# Patient Record
Sex: Female | Born: 1960 | Race: Black or African American | Hispanic: No | Marital: Married | Smoking: Never smoker
Health system: Southern US, Community
[De-identification: ages and names within clinical notes are randomized; demographics above are authoritative.]

## PROBLEM LIST (undated history)

## (undated) DIAGNOSIS — I639 Cerebral infarction, unspecified: Secondary | ICD-10-CM

## (undated) DIAGNOSIS — M5030 Other cervical disc degeneration, unspecified cervical region: Secondary | ICD-10-CM

## (undated) DIAGNOSIS — E119 Type 2 diabetes mellitus without complications: Secondary | ICD-10-CM

## (undated) DIAGNOSIS — I2699 Other pulmonary embolism without acute cor pulmonale: Secondary | ICD-10-CM

## (undated) DIAGNOSIS — I10 Essential (primary) hypertension: Secondary | ICD-10-CM

## (undated) HISTORY — PX: HYSTEROSCOPY: SHX211

## (undated) HISTORY — PX: HAND SURGERY: SHX662

## (undated) HISTORY — PX: CHOLECYSTECTOMY: SHX55

---

## 2006-10-13 ENCOUNTER — Ambulatory Visit: Payer: Self-pay | Admitting: Orthopedic Surgery

## 2006-10-20 ENCOUNTER — Ambulatory Visit: Payer: Self-pay | Admitting: Orthopedic Surgery

## 2006-10-29 ENCOUNTER — Ambulatory Visit: Payer: Self-pay | Admitting: Orthopedic Surgery

## 2006-11-04 ENCOUNTER — Encounter: Admission: RE | Admit: 2006-11-04 | Discharge: 2006-11-04 | Payer: Self-pay | Admitting: Orthopedic Surgery

## 2006-11-06 ENCOUNTER — Ambulatory Visit: Payer: Self-pay | Admitting: Orthopedic Surgery

## 2006-11-18 ENCOUNTER — Encounter: Admission: RE | Admit: 2006-11-18 | Discharge: 2006-11-18 | Payer: Self-pay | Admitting: Orthopedic Surgery

## 2006-11-25 ENCOUNTER — Ambulatory Visit: Payer: Self-pay | Admitting: Orthopedic Surgery

## 2006-11-25 ENCOUNTER — Ambulatory Visit (HOSPITAL_COMMUNITY): Admission: RE | Admit: 2006-11-25 | Discharge: 2006-11-25 | Payer: Self-pay | Admitting: Internal Medicine

## 2006-11-27 ENCOUNTER — Ambulatory Visit: Payer: Self-pay | Admitting: Orthopedic Surgery

## 2006-12-02 ENCOUNTER — Encounter: Admission: RE | Admit: 2006-12-02 | Discharge: 2006-12-02 | Payer: Self-pay | Admitting: Orthopedic Surgery

## 2006-12-08 ENCOUNTER — Ambulatory Visit: Payer: Self-pay | Admitting: Orthopedic Surgery

## 2006-12-15 ENCOUNTER — Ambulatory Visit: Payer: Self-pay | Admitting: Orthopedic Surgery

## 2006-12-17 ENCOUNTER — Encounter: Payer: Self-pay | Admitting: Orthopedic Surgery

## 2007-09-08 ENCOUNTER — Ambulatory Visit: Payer: Self-pay | Admitting: Orthopedic Surgery

## 2007-09-08 DIAGNOSIS — M545 Low back pain: Secondary | ICD-10-CM

## 2007-09-15 ENCOUNTER — Telehealth: Payer: Self-pay | Admitting: Orthopedic Surgery

## 2007-09-28 ENCOUNTER — Encounter: Admission: RE | Admit: 2007-09-28 | Discharge: 2007-09-28 | Payer: Self-pay | Admitting: Orthopedic Surgery

## 2007-10-12 ENCOUNTER — Encounter: Admission: RE | Admit: 2007-10-12 | Discharge: 2007-10-12 | Payer: Self-pay | Admitting: Orthopedic Surgery

## 2007-10-19 ENCOUNTER — Ambulatory Visit: Payer: Self-pay | Admitting: Orthopedic Surgery

## 2007-11-13 ENCOUNTER — Telehealth: Payer: Self-pay | Admitting: Orthopedic Surgery

## 2008-02-29 ENCOUNTER — Ambulatory Visit: Payer: Self-pay | Admitting: Internal Medicine

## 2008-02-29 DIAGNOSIS — N951 Menopausal and female climacteric states: Secondary | ICD-10-CM | POA: Insufficient documentation

## 2008-02-29 DIAGNOSIS — J309 Allergic rhinitis, unspecified: Secondary | ICD-10-CM | POA: Insufficient documentation

## 2008-02-29 DIAGNOSIS — I499 Cardiac arrhythmia, unspecified: Secondary | ICD-10-CM | POA: Insufficient documentation

## 2008-02-29 DIAGNOSIS — E785 Hyperlipidemia, unspecified: Secondary | ICD-10-CM

## 2008-02-29 DIAGNOSIS — I10 Essential (primary) hypertension: Secondary | ICD-10-CM | POA: Insufficient documentation

## 2008-02-29 DIAGNOSIS — I509 Heart failure, unspecified: Secondary | ICD-10-CM | POA: Insufficient documentation

## 2008-02-29 DIAGNOSIS — M1990 Unspecified osteoarthritis, unspecified site: Secondary | ICD-10-CM | POA: Insufficient documentation

## 2008-02-29 DIAGNOSIS — IMO0001 Reserved for inherently not codable concepts without codable children: Secondary | ICD-10-CM | POA: Insufficient documentation

## 2008-02-29 DIAGNOSIS — J45909 Unspecified asthma, uncomplicated: Secondary | ICD-10-CM | POA: Insufficient documentation

## 2008-02-29 DIAGNOSIS — Z87898 Personal history of other specified conditions: Secondary | ICD-10-CM | POA: Insufficient documentation

## 2008-02-29 DIAGNOSIS — F411 Generalized anxiety disorder: Secondary | ICD-10-CM | POA: Insufficient documentation

## 2008-02-29 DIAGNOSIS — N318 Other neuromuscular dysfunction of bladder: Secondary | ICD-10-CM | POA: Insufficient documentation

## 2008-02-29 DIAGNOSIS — E0789 Other specified disorders of thyroid: Secondary | ICD-10-CM | POA: Insufficient documentation

## 2008-02-29 DIAGNOSIS — K219 Gastro-esophageal reflux disease without esophagitis: Secondary | ICD-10-CM

## 2008-02-29 DIAGNOSIS — K5900 Constipation, unspecified: Secondary | ICD-10-CM | POA: Insufficient documentation

## 2008-03-01 ENCOUNTER — Telehealth (INDEPENDENT_AMBULATORY_CARE_PROVIDER_SITE_OTHER): Payer: Self-pay | Admitting: Internal Medicine

## 2008-03-01 DIAGNOSIS — E669 Obesity, unspecified: Secondary | ICD-10-CM | POA: Insufficient documentation

## 2008-03-03 ENCOUNTER — Telehealth (INDEPENDENT_AMBULATORY_CARE_PROVIDER_SITE_OTHER): Payer: Self-pay | Admitting: Internal Medicine

## 2008-03-07 ENCOUNTER — Encounter (INDEPENDENT_AMBULATORY_CARE_PROVIDER_SITE_OTHER): Payer: Self-pay | Admitting: Internal Medicine

## 2008-03-08 ENCOUNTER — Ambulatory Visit: Payer: Self-pay | Admitting: Internal Medicine

## 2008-03-08 ENCOUNTER — Ambulatory Visit (HOSPITAL_COMMUNITY): Admission: RE | Admit: 2008-03-08 | Discharge: 2008-03-08 | Payer: Self-pay | Admitting: Internal Medicine

## 2008-03-23 ENCOUNTER — Encounter (INDEPENDENT_AMBULATORY_CARE_PROVIDER_SITE_OTHER): Payer: Self-pay | Admitting: Internal Medicine

## 2008-03-28 ENCOUNTER — Ambulatory Visit: Payer: Self-pay | Admitting: Internal Medicine

## 2008-03-28 DIAGNOSIS — G4700 Insomnia, unspecified: Secondary | ICD-10-CM

## 2008-03-29 ENCOUNTER — Encounter (INDEPENDENT_AMBULATORY_CARE_PROVIDER_SITE_OTHER): Payer: Self-pay | Admitting: Internal Medicine

## 2008-04-04 ENCOUNTER — Telehealth (INDEPENDENT_AMBULATORY_CARE_PROVIDER_SITE_OTHER): Payer: Self-pay | Admitting: *Deleted

## 2008-04-25 ENCOUNTER — Telehealth (INDEPENDENT_AMBULATORY_CARE_PROVIDER_SITE_OTHER): Payer: Self-pay | Admitting: *Deleted

## 2008-04-25 ENCOUNTER — Ambulatory Visit: Payer: Self-pay | Admitting: Internal Medicine

## 2008-04-27 ENCOUNTER — Ambulatory Visit: Payer: Self-pay | Admitting: Internal Medicine

## 2008-04-27 ENCOUNTER — Ambulatory Visit (HOSPITAL_COMMUNITY): Admission: RE | Admit: 2008-04-27 | Discharge: 2008-04-27 | Payer: Self-pay | Admitting: Internal Medicine

## 2008-04-27 DIAGNOSIS — J45901 Unspecified asthma with (acute) exacerbation: Secondary | ICD-10-CM | POA: Insufficient documentation

## 2008-04-27 LAB — CONVERTED CEMR LAB
AST: 23 units/L (ref 0–37)
Albumin: 3.6 g/dL (ref 3.5–5.2)
BUN: 10 mg/dL (ref 6–23)
Basophils Absolute: 0 10*3/uL (ref 0.0–0.1)
Eosinophils Absolute: 0.2 10*3/uL (ref 0.0–0.7)
Glucose, Bld: 94 mg/dL (ref 70–99)
HCT: 42.3 % (ref 36.0–46.0)
Hemoglobin: 14.1 g/dL (ref 12.0–15.0)
Lymphs Abs: 3.3 10*3/uL (ref 0.7–4.0)
MCV: 94 fL (ref 78.0–100.0)
Neutro Abs: 4.3 10*3/uL (ref 1.7–7.7)
Neutrophils Relative %: 49 % (ref 43–77)
Potassium: 3.8 meq/L (ref 3.5–5.3)
RBC: 4.49 M/uL (ref 3.87–5.11)
Total Bilirubin: 0.7 mg/dL (ref 0.3–1.2)
Total Protein: 6.9 g/dL (ref 6.0–8.3)

## 2008-05-02 ENCOUNTER — Telehealth (INDEPENDENT_AMBULATORY_CARE_PROVIDER_SITE_OTHER): Payer: Self-pay | Admitting: Internal Medicine

## 2008-05-04 ENCOUNTER — Ambulatory Visit: Payer: Self-pay | Admitting: Internal Medicine

## 2008-05-04 ENCOUNTER — Encounter
Admission: RE | Admit: 2008-05-04 | Discharge: 2008-08-02 | Payer: Self-pay | Admitting: Physical Medicine & Rehabilitation

## 2008-05-04 DIAGNOSIS — R609 Edema, unspecified: Secondary | ICD-10-CM

## 2008-05-04 DIAGNOSIS — M810 Age-related osteoporosis without current pathological fracture: Secondary | ICD-10-CM | POA: Insufficient documentation

## 2008-05-04 LAB — CONVERTED CEMR LAB
Blood in Urine, dipstick: NEGATIVE
Ketones, urine, test strip: NEGATIVE
Nitrite: NEGATIVE
Urobilinogen, UA: 0.2
WBC Urine, dipstick: NEGATIVE

## 2008-05-05 ENCOUNTER — Telehealth (INDEPENDENT_AMBULATORY_CARE_PROVIDER_SITE_OTHER): Payer: Self-pay | Admitting: Internal Medicine

## 2008-05-16 ENCOUNTER — Ambulatory Visit: Payer: Self-pay | Admitting: Physical Medicine & Rehabilitation

## 2008-05-16 ENCOUNTER — Telehealth (INDEPENDENT_AMBULATORY_CARE_PROVIDER_SITE_OTHER): Payer: Self-pay | Admitting: *Deleted

## 2008-05-17 ENCOUNTER — Telehealth (INDEPENDENT_AMBULATORY_CARE_PROVIDER_SITE_OTHER): Payer: Self-pay | Admitting: Internal Medicine

## 2008-05-19 ENCOUNTER — Telehealth (INDEPENDENT_AMBULATORY_CARE_PROVIDER_SITE_OTHER): Payer: Self-pay | Admitting: Internal Medicine

## 2008-05-20 ENCOUNTER — Telehealth (INDEPENDENT_AMBULATORY_CARE_PROVIDER_SITE_OTHER): Payer: Self-pay | Admitting: Internal Medicine

## 2008-05-25 ENCOUNTER — Encounter (INDEPENDENT_AMBULATORY_CARE_PROVIDER_SITE_OTHER): Payer: Self-pay | Admitting: Internal Medicine

## 2008-05-30 ENCOUNTER — Telehealth (INDEPENDENT_AMBULATORY_CARE_PROVIDER_SITE_OTHER): Payer: Self-pay | Admitting: *Deleted

## 2008-06-01 ENCOUNTER — Ambulatory Visit: Payer: Self-pay | Admitting: Internal Medicine

## 2009-06-12 ENCOUNTER — Ambulatory Visit: Payer: Self-pay | Admitting: Orthopedic Surgery

## 2009-06-12 DIAGNOSIS — M67919 Unspecified disorder of synovium and tendon, unspecified shoulder: Secondary | ICD-10-CM | POA: Insufficient documentation

## 2009-06-12 DIAGNOSIS — M719 Bursopathy, unspecified: Secondary | ICD-10-CM

## 2009-06-12 DIAGNOSIS — M7580 Other shoulder lesions, unspecified shoulder: Secondary | ICD-10-CM

## 2009-07-06 ENCOUNTER — Ambulatory Visit: Payer: Self-pay | Admitting: Orthopedic Surgery

## 2009-07-06 DIAGNOSIS — IMO0002 Reserved for concepts with insufficient information to code with codable children: Secondary | ICD-10-CM

## 2009-09-05 ENCOUNTER — Ambulatory Visit: Payer: Self-pay | Admitting: Orthopedic Surgery

## 2009-09-06 ENCOUNTER — Telehealth: Payer: Self-pay | Admitting: Orthopedic Surgery

## 2009-09-06 ENCOUNTER — Encounter (INDEPENDENT_AMBULATORY_CARE_PROVIDER_SITE_OTHER): Payer: Self-pay | Admitting: *Deleted

## 2009-09-06 DIAGNOSIS — M2550 Pain in unspecified joint: Secondary | ICD-10-CM | POA: Insufficient documentation

## 2009-09-07 ENCOUNTER — Telehealth: Payer: Self-pay | Admitting: Orthopedic Surgery

## 2009-09-07 ENCOUNTER — Encounter: Payer: Self-pay | Admitting: Orthopedic Surgery

## 2009-09-14 ENCOUNTER — Ambulatory Visit: Payer: Self-pay | Admitting: Orthopedic Surgery

## 2009-09-14 ENCOUNTER — Encounter (INDEPENDENT_AMBULATORY_CARE_PROVIDER_SITE_OTHER): Payer: Self-pay | Admitting: *Deleted

## 2009-09-21 ENCOUNTER — Telehealth: Payer: Self-pay | Admitting: Orthopedic Surgery

## 2009-09-22 ENCOUNTER — Ambulatory Visit: Payer: Self-pay | Admitting: Orthopedic Surgery

## 2009-09-22 ENCOUNTER — Ambulatory Visit (HOSPITAL_COMMUNITY): Admission: RE | Admit: 2009-09-22 | Discharge: 2009-09-22 | Payer: Self-pay | Admitting: Orthopedic Surgery

## 2009-09-27 ENCOUNTER — Ambulatory Visit: Payer: Self-pay | Admitting: Orthopedic Surgery

## 2009-11-13 ENCOUNTER — Encounter: Payer: Self-pay | Admitting: Orthopedic Surgery

## 2010-05-13 ENCOUNTER — Encounter: Payer: Self-pay | Admitting: Internal Medicine

## 2010-05-14 ENCOUNTER — Encounter: Payer: Self-pay | Admitting: Orthopedic Surgery

## 2010-05-22 NOTE — Letter (Signed)
Summary: [facility]  [address]  [place], [place] [address]   Phone: [phone]  Fax: [phone]     Today's Date: June 12, 2009  Name of Patient: Katherine Barry  The above named patient had a medical visit today at:  2:00pm.  Please take this into consideration when reviewing the time away from work.    [ X] To be off the remainder of today, returning to the normal work / school schedule tomorrow, 06/13/09.         - Next scheduled appointment:  June 26, 2009, 2:15pm   ______________________.   Sincerely yours,   Terrance Mass, MD

## 2010-05-22 NOTE — Letter (Signed)
Summary: *Orthopedic Missed Appt/ No Show Letter  [facility]  [address]  [place], [place] [address]   Phone: [phone]  Fax: [phone]    11/13/2009  Katherine Barry  [address] [place], [place] [address]     Dear Ms. Poland,   Our records indicate that you missed your scheduled appointment with Dr. Beaulah Corin on 11/08/2009.  Please contact this office to reschedule your appointment as soon as possible.  It is important that you keep your scheduled appointments with your physician, so we can provide you the best care possible.        Sincerely,    Dr. Terrance Mass, MD [facility] Phone [phone]

## 2010-05-22 NOTE — Miscellaneous (Signed)
Summary: rheumatology referral  Clinical Lists Changes  Problems: Added new problem of PAIN IN JOINT, MULTIPLE SITES (ICD-719.49) Orders: Added new Referral order of Rheumatology Referral (Rheumatology) - Signed

## 2010-05-22 NOTE — Progress Notes (Signed)
Summary: No pre-auth required for out-patient procedure  Phone Note Outgoing Call   Call placed to: Insurer Summary of Call: No pre-cert required for out-patient procedure sched'd at [facility] 09/22/09 ( [id number]) - insurer Medicare. Initial call taken by: Cammie Sickle,  September 21, 2009 2:00 PM

## 2010-05-22 NOTE — Assessment & Plan Note (Signed)
Summary: MRI results from [facility]/ pt aware to bring films/frs   Visit Type:  Follow-up Primary Provider:  Dr. Naida Sleight  CC:  right shoulder pain.  History of Present Illness: I saw Katherine Barry in the office today for a followup visit.  She is a 50 years old woman with the complaint of:  right shoulder.  50 year old female who I treated in February for impingement syndrome with subacromial injection, Voltaren gel and Norco 5 mg presents back with recurrent RIGHT shoulder pain and multiple joint swelling.  Review of Systems: She was previously treated for rheumatoid arthritis with injection therapy but her doctor left-handed she has not been treated for several years.  All of her joints are aching and swelling.  Her RIGHT shoulder is hurting worse.  She has upper arm pain with painful forward elevation and radiation into the upper arm.   she is bilateral hand swelling with history of RIGHT carpal tunnel release and history of LEFT carpal tunnel syndrome  MRI results. [facility] : 09/07/2009  MILD AC JOINT CHANGES NON SURGICAL  PARTIAL THICKNESS TEAR SUPRASPINATUS  TENDONOSIS SUPRASPINATUS    Current Medications (verified): 1)  Norco 5-325 Mg Tabs (Hydrocodone-Acetaminophen) .Marland Kitchen.. 1 By Mouth Q 4 As Needed Pain 2)  Voltaren 1 % Gel (Diclofenac Sodium) .... Apply 4 Grams To Area Bid 3)  Toprol Xl 25 Mg Xr24h-Tab (Metoprolol Succinate) 4)  Ec-Naprosyn 500 Mg Tbec (Naproxen) .Marland Kitchen.. 1 By Mouth Two Times A Day  Allergies (verified): No Known Drug Allergies  Past History:  Past Medical History: Last updated: 05/04/2008 Allergic rhinitis Anxiety Asthma Congestive heart failure GERD Hyperlipidemia Hypertension Low back pain Osteoarthritis arrhythmia bronchitis, chronic constipation overactive bladder fibromyalgia migraines, hx thyroid goiter  Past Surgical History: Last updated: 02/29/2008 Cholecystectomy hysterectomy with bilat SOO Appendectomy  Family History: Last  updated: 04-03-2008 father-deceased-66-CVD, CVA mother--HTN, "breathing problems" son-27 daughter-29 5 adopted children  Social History: Last updated: 02/29/2008 Married lives with spouse and children Never Smoked Alcohol use-no Drug use-no  Disabled for "fibromyalgia" and a little bit of everything.    Risk Factors: Smoking Status: never (02/29/2008)  Review of Systems Constitutional:  Denies weight loss, weight gain, fever, chills, and fatigue. Cardiovascular:  Denies chest pain, palpitations, fainting, and murmurs. Respiratory:  Denies short of breath, wheezing, couch, tightness, pain on inspiration, and snoring . Gastrointestinal:  Denies heartburn, nausea, vomiting, diarrhea, constipation, and blood in your stools. Genitourinary:  Denies frequency, urgency, difficulty urinating, painful urination, flank pain, and bleeding in urine. Neurologic:  Complains of numbness; denies tingling, unsteady gait, dizziness, tremors, and seizure; both hands. Musculoskeletal:  See HPI. Endocrine:  Denies excessive thirst, exessive urination, and heat or cold intolerance. Psychiatric:  Denies nervousness, depression, anxiety, and hallucinations. Skin:  Denies changes in the skin, poor healing, rash, itching, and redness. HEENT:  Denies blurred or double vision, eye pain, redness, and watering. Immunology:  Denies seasonal allergies, sinus problems, and allergic to bee stings. Hemoatologic:  Denies easy bleeding and brusing.  Physical Exam  Additional Exam:  Constitutional: vital signs see recorded values. General: normal development, nutrition, and grooming. No deformity. Body Habitus is large CDV: Observation and palpation was normal  Lymph: palpation of the lymph nodes were normal Skin: inspection and palpation of the skin revealed no abnormalities  Neuro: coordination: normal              DTR's normal              Sensation was normal  Psyche: Alert  and oriented x 3. Mood was normal.   Affect: normal  MSK: Gait: normal   She is tender in the upper arm over the lateral deltoid and posterior subacromial area.  She has active range of motion of 90 forward elevation and 120 active range of motion with painful arc of motion 90-120.  The joint remained stable her motor grade was difficult to assess but essentially is a grade 5 minus.  Skin is intact skin is not warm to touch.  Pulses normal.  C-Spine normal range of motion no tenderness  LOWER EXTREMS: Normal alignment and no atrophy, subluxation or tremor or contracture       Impression & Recommendations:  Problem # 1:  BURSITIS, RIGHT SHOULDER (ICD-726.10) Assessment Unchanged  SARS ASAD plane the AC joint if needed   Orders: Est. Patient Level IV (09811)  Patient Instructions: 1)  Offered surgery vs non op treatment  2)  You have chosen surgery 3)  You will have arthroscopy of the shoulder with bursectomy 4)  DOS 09/22/09  5)  Preop, I will call you later and let you know when to go to [place] short stay center for preop, make sure you take packet with you 6)  Post op 1 in our office on 09/27/09 Prescriptions: NORCO 5-325 MG TABS (HYDROCODONE-ACETAMINOPHEN) 1 by mouth q 4 as needed pain  #84 x 0   Entered and Authorized by:   Fuller Canada MD   Signed by:   Fuller Canada MD on 09/14/2009   Method used:   Print then Give to Patient   RxID:   [id number]

## 2010-05-22 NOTE — Letter (Signed)
Summary: surgey order  surgey order   Imported By: Cammie Sickle 09/27/2009 17:56:16  _____________________________________________________________________  External Attachment:    Type:   Image     Comment:   External Document

## 2010-05-22 NOTE — Progress Notes (Signed)
Summary: MRI appointment.  Phone Note Outgoing Call   Call placed by: Waldon Reining,  Sep 06, 2009 3:48 PM Call placed to: Patient Action Taken: Appt scheduled Summary of Call: I called to give patient her MRI appointment at [facility] on 09-07-09 at %;15 pm. Patient has Medicare, no precert is needed. Patient is aware to bring [place] a copy of her films. Patient will follow up back here, to go over her results.

## 2010-05-22 NOTE — Assessment & Plan Note (Signed)
Summary: 2 wk reck rt shulder/uhc/bcbs/bsf   Visit Type:  Follow-up  CC:  recheck rt shoulder.  History of Present Illness:  I saw Katherine Barry in the office today for a followup visit.  She is a 50 years old woman with the complaint of:  NEW PROBLEM. RIGHT SHOULDER PAIN.  Katherine Barry comes to [place] with a 2 month history of atraumatic onset of throbbing stabbing severe pain in the RIGHT shoulder which was sudden in onset worse when she moves her arm or tries to lay on her RIGHT side.  She has associated swelling and difficulty with forward elevation.  Treatment: Sling, med and injection.  MEDS:  Norco 5 for pain.  AND TODAY:   Complaints: helped alot.  Today, scheduled for: 2 week recheck right shoulder after injection.  ROS: Has pain and swelling in the left leg from thigh to the ankle, no injury, no gout hx, has HTN, takes Fluid pills.  Complains of medial knee pain and swelling and lack of flexion.  Review of systems shoulder is better, movement is better, pain is less.       Allergies: No Known Drug Allergies  Physical Exam  Additional Exam:  there is some swelling in the LEFT leg it is actually diffuse from the thigh down into the leg and calf pain negative Homans sign negative  Tenderness medial bursa no joint effusion flexion 110 full extension.     Impression & Recommendations:  Problem # 1:  BURSITIS, RIGHT SHOULDER (ICD-726.10) Assessment Improved  Orders: Est. Patient Level III ([address]454)  Problem # 2:  ANSERINE BURSITIS, LEFT (ICD-726.61) Assessment: New  recommend Voltaren gel 4 g b.i.d. ice 3 times a day follow up as needed  Orders: Est. Patient Level III (09811)  Patient Instructions: 1)  Ice knee three times a day for 30 minutes at a time 2)  Use the Voltaren creme as directed 3)  Come back as needed.

## 2010-05-22 NOTE — Assessment & Plan Note (Signed)
Summary: rt shoulder pain/NEEDS XRAY/frs   Visit Type:  new problem  CC:  right shoulder pain.  History of Present Illness: I saw Katherine Barry in the office today for a followup visit.  She is a 50 years old woman with the complaint of:  NEW PROBLEM. RIGHT SHOULDER PAIN.  Katherine Barry comes to [place] with a 2 month history of atraumatic onset of throbbing stabbing severe pain in the RIGHT shoulder which was sudden in onset worse when she moves her arm or tries to lay on her RIGHT side.  She has associated swelling and difficulty with forward elevation.      Allergies (verified): No Known Drug Allergies  Past History:  Past Medical History: Last updated: 05/04/2008 Allergic rhinitis Anxiety Asthma Congestive heart failure GERD Hyperlipidemia Hypertension Low back pain Osteoarthritis arrhythmia bronchitis, chronic constipation overactive bladder fibromyalgia migraines, hx thyroid goiter  Past Surgical History: Last updated: 02/29/2008 Cholecystectomy hysterectomy with bilat SOO Appendectomy  Family History: Last updated: 2008/04/17 father-deceased-66-CVD, CVA mother--HTN, "breathing problems" son-27 daughter-29 5 adopted children  Risk Factors: Smoking Status: never (02/29/2008)  Review of Systems Cardiac :  Denies chest pain, angina, heart attack, heart failure, poor circulation, blood clots, and phlebitis; palpitations. MS:  Complains of joint pain and joint swelling; denies rheumatoid arthritis, gout, bone cancer, osteoporosis, and ; stiffness, redness, heat, muscle pain. EENT:  Denies poor vision, cataracts, glaucoma, poor hearing, vertigo, ears ringing, sinusitis, hoarseness, toothaches, and bleeding gums; seasonal allergies.  The review of systems is negative for General, Resp, GI, GU, Neuro, Endo, Psych, Derm, Immunology, and Lymphatic.  Physical Exam  Additional Exam:   VS reviewed and were normal Height 5 6-1/2, weight 262 pounds, pulse 76. GEN:  appearance was normal , she was obese  CDV: normal pulses temperature and no edema  LYMPH nodes were normal   SKIN was normal   Neuro: normal sensation Psyche: AAO x 3 and mood was flat  Her ambulation was normal  RIGHT shoulder tender in the subacromial space posteriorly, anterolateral deltoid.  Nontender over the a.c. joint.  She really had no active motion although she attempted she had severe pain.  Passive range of motion external rotation 40 abduction 70, forward elevation 80.  Muscle tone was normal.  I cannot test the supraspinatus muscle well because of the motion deficit and pain.  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 Patient Level IV (81191) Joint Aspirate / Injection, Large (20610) Depo- Medrol 40mg  (J1030) Shoulder x-ray,  minimum 2 views (47829)  Subacromial injection RIGHT shoulder standard technique standard prep with ethyl chloride used anesthetize the skin.  40 mg of Depo-Medrol mixed with lidocaine 1% 5 cc injected no complications  AP lateral RIGHT shoulder was ordered and  Interpretation there are normal bony contours to the RIGHT shoulder.  There is a type II acromion.  The glenohumeral joint is normal.  Shenton's line of the shoulder intact.  Assessment normal RIGHT shoulder  Problem # 2:  BURSITIS, RIGHT SHOULDER (ICD-726.10) Assessment: New Assessment the patient most likely had an acute case of bursitis which has persisted  Recommend A. mobilize with the sling, use ice, take Vicodin for pain.  Medications Added to Medication List This Visit: 1)  Norco 5-325 Mg Tabs (Hydrocodone-acetaminophen) .Marland Kitchen.. 1 by mouth  q 4 as needed pain  Patient Instructions: 1)  You have received an injection of cortisone today. You may experience  increased pain at the injection site. Apply ice pack to the area for 20 minutes every 2 hours and take 2 xtra strength tylenol every 8 hours. This increased pain will usually resolve in 24 hours. The injection will take effect in 3-10 days.  2)  take  norco for pain  3)  rest the arm in a sling  4)  f/u 2 weeks  Prescriptions: NORCO 5-325 MG TABS (HYDROCODONE-ACETAMINOPHEN) 1 by mouth q 4 as needed pain  #84 x 0   Entered and Authorized by:   Fuller Canada MD   Signed by:   Fuller Canada MD on 06/12/2009   Method used:   Print then Give to Patient   RxID:   [id number]

## 2010-05-22 NOTE — Letter (Signed)
Summary: Work [facility]  [address]  [place], [place] [address]   Phone: [phone]  Fax: [phone]     Today's Date: Sep 14, 2009  Name of Patient: Katherine Barry  The above named patient had a medical visit today,  09/22/2009.  Please take this into consideration when reviewing the time away from work.    Arly.[email or web address]  ] Other ________________________________________________________________  [ X ] To be off work, secondary to surgery:         Start:  09/22/09         End:  10/02/09, or until otherwise notified..      Sincerely,   Terrance Mass, MD

## 2010-05-22 NOTE — Letter (Signed)
Summary: History form  History form   Imported By: Jacklynn Ganong 06/16/2009 09:05:16  _____________________________________________________________________  External Attachment:    Type:   Image     Comment:   External Document

## 2010-05-22 NOTE — Medication Information (Signed)
Summary: RX Folder  RX Folder   Imported By: Cammie Sickle 08/12/2009 11:10:30  _____________________________________________________________________  External Attachment:    Type:   Image     Comment:   External Document

## 2010-05-22 NOTE — Assessment & Plan Note (Signed)
Summary: POST OP 1/RT SHOULDER SURG 09/22/09/MEDICARE/   Visit Type:  Follow-up Primary Provider:  Dr. Naida Sleight  CC:  post op 1 shoulder.  History of Present Illness: I saw Katherine Barry in the office today for a followup visit.  She is a 50 years old woman with the complaint of:  post op 1 right shoulder.  DOS 09/22/09 Arthroscopic subacromial decompression with surgical arthroscopy rt shoulder, limited debridement.  POD 5.  Today to check incision.  Start PT next week.  Meds: Percocet 5, Robaxin, and Ibuprofen 800, has not had to have meds for 2 days.    Allergies: No Known Drug Allergies  Physical Exam  Additional Exam:  subQ bleedeing on both sides of the joint with mild erythema   staples out      Impression & Recommendations:  Problem # 1:  AFTERCARE HEALING TRAUMATIC FRACTURE OTHER BONE (ICD-V54.19) Assessment Comment Only  posterior splint   Orders: Post-Op Check (46962) Short Leg Splint (95284)  Patient Instructions: 1)  wear sling  2)  start PT next week  3)  f/u in 6 weeks   Appended Document: POST OP 1/RT SHOULDER SURG 09/22/09/MEDICARE/ error  sling applied

## 2010-05-22 NOTE — Assessment & Plan Note (Signed)
Summary: rt shoulder hurting again xr 06/12/09/medicare/bsf   Visit Type:  Follow-up Primary Provider:  Dr. Naida Sleight  CC:  right shoulder pain.  History of Present Illness: 50 year old female who I treated in February for impingement syndrome with subacromial injection, Voltaren gel and Norco 5 mg presents back with recurrent RIGHT shoulder pain and multiple joint swelling.  She was previously treated for rheumatoid arthritis with injection therapy but her doctor left-handed she has not been treated for several years.  All of her joints are aching and swelling.  Her RIGHT shoulder is hurting worse.  She has upper arm pain with painful forward elevation and radiation into the upper arm.  review of systems she is bilateral hand swelling with history of RIGHT carpal tunnel release and history of LEFT carpal tunnel syndrome   Current Medications (verified): 1)  Norco 5-325 Mg Tabs (Hydrocodone-Acetaminophen) .Marland Kitchen.. 1 By Mouth Q 4 As Needed Pain 2)  Voltaren 1 % Gel (Diclofenac Sodium) .... Apply 4 Grams To Area Bid 3)  Toprol Xl 25 Mg Xr24h-Tab (Metoprolol Succinate) 4)  Ec-Naprosyn 500 Mg Tbec (Naproxen) .Marland Kitchen.. 1 By Mouth Two Times A Day  Allergies (verified): No Known Drug Allergies  Past History:  Past Medical History: Last updated: 05/04/2008 Allergic rhinitis Anxiety Asthma Congestive heart failure GERD Hyperlipidemia Hypertension Low back pain Osteoarthritis arrhythmia bronchitis, chronic constipation overactive bladder fibromyalgia migraines, hx thyroid goiter  Past Surgical History: Last updated: 02/29/2008 Cholecystectomy hysterectomy with bilat SOO Appendectomy  Family History: Last updated: 04-21-08 father-deceased-66-CVD, CVA mother--HTN, "breathing problems" son-27 daughter-29 5 adopted children  Social History: Last updated: 02/29/2008 Married lives with spouse and children Never Smoked Alcohol use-no Drug use-no  Disabled for  "fibromyalgia" and a little bit of everything.    Risk Factors: Smoking Status: never (02/29/2008)  Review of Systems      See HPI Neurologic:  numbness and tingling in both hands RIGHT less than the LEFT. Musculoskeletal:  See HPI.  Physical Exam  Additional Exam:  1. General appearance was normal  2. Oriented x 3  3. Mood was normal  4. Gait was normal  She is tender in the upper arm over the lateral deltoid and posterior subacromial area.  She has active range of motion of 90 forward elevation and 120 active range of motion with painful arc of motion 90-120.  The joint remained stable her motor grade was difficult to assess but essentially is a grade 5 minus.  Skin is intact skin is not warm to touch.  Pulses normal.  C-Spine normal range of motion no tenderness     Impression & Recommendations:  Problem # 1:  BURSITIS, RIGHT SHOULDER (ICD-726.10) Assessment New  Orders: Est. 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 Patient Level IV (60454) I suspect she has rheumatoid arthritis based on the treatment she was receiving some recommending a rheumatology consult as far as the shoulder goes recommend MRI to rule out cuff tear and if no cuff tears present continue to treat the bursitis  Medications Added to Medication List This Visit: 1)  Toprol Xl 25 Mg Xr24h-tab (Metoprolol succinate) 2)  Ec-naprosyn 500 Mg Tbec (Naproxen) .Marland Kitchen.. 1 by mouth two times a day  Patient Instructions: 1)  MRI right shoulder  2)  Rheumatology consult  Prescriptions: NORCO 5-325 MG TABS (HYDROCODONE-ACETAMINOPHEN) 1 by mouth q 4 as needed pain  #84 x 0   Entered and Authorized by:   Fuller Canada  MD   Signed by:   Fuller Canada MD on 09/05/2009   Method used:   Print then Give to Patient   RxID:   [id number] EC-NAPROSYN 500 MG TBEC (NAPROXEN) 1 by mouth two times a day  #60 x 1   Entered and Authorized by:    Fuller Canada MD   Signed by:   Fuller Canada MD on 09/05/2009   Method used:   Print then Give to Patient   RxID:   [id number]

## 2010-05-22 NOTE — Progress Notes (Signed)
Summary: MRI appointment.  Phone Note Outgoing Call   Call placed by: Waldon Reining,  Sep 07, 2009 3:22 PM Call placed to: Patient Action Taken: Phone Call Completed, Appt scheduled Summary of Call: I called to give the patient her MRI appointment at [facility] on 09-07-09 at 5:15. Patient is aware to get a copy of her films to bring back to our office. Patient has Medicare, no precert is needed. Patient will follow up back here to go over her results.

## 2010-05-22 NOTE — Progress Notes (Signed)
Summary: Rheumatology consult.  Phone Note Outgoing Call   Call placed by: Waldon Reining,  Sep 07, 2009 3:20 PM Call placed to: Specialist Action Taken: Information Sent Summary of Call: I faxed a referral for this patient to Dr. Corliss Skains to be seen for rheumatology consult.

## 2010-07-09 LAB — CBC
HCT: 39.7 % (ref 36.0–46.0)
Hemoglobin: 13.4 g/dL (ref 12.0–15.0)
MCV: 91.6 fL (ref 78.0–100.0)
RBC: 4.33 MIL/uL (ref 3.87–5.11)
WBC: 7.3 10*3/uL (ref 4.0–10.5)

## 2010-07-09 LAB — BASIC METABOLIC PANEL
BUN: 9 mg/dL (ref 6–23)
Calcium: 9.4 mg/dL (ref 8.4–10.5)
Chloride: 104 mEq/L (ref 96–112)
Creatinine, Ser: 0.86 mg/dL (ref 0.4–1.2)
Glucose, Bld: 109 mg/dL — ABNORMAL HIGH (ref 70–99)
Potassium: 4 mEq/L (ref 3.5–5.1)

## 2010-08-02 ENCOUNTER — Other Ambulatory Visit: Payer: Self-pay | Admitting: *Deleted

## 2010-08-02 NOTE — Telephone Encounter (Signed)
Denied rx for pain med refill has been no show for last appts, ntbs for further refills

## 2010-09-04 NOTE — Group Therapy Note (Signed)
CHIEF COMPLAINT:  Back pain greater than neck pain.   The patient referred by Erle Crocker.   HISTORY OF PRESENT ILLNESS:  A 50 year old female with back pain dating  back to 10 years ago.  No history of an injury.  She has worked as a Lawyer  over the years.  In addition, she has had neck pain, but this has been  for a few years compared to 10 years.  She had physical therapy sometime  in the past, although she cannot tell me exactly when or for which of  her pain.  She indicates that her average pain is 7/10, but current pain  is 10/10, it involves the low back, the neck, as well as lower  extremities including the knees.  She states that she has had some knee  injections in the past that seem to help for time, but then more  recently when she saw Dr. Romeo Apple from Orthopedics and they were  repeated, they did not seem quite as helpful.   She has undergone L4-5 translaminar lumbar epidural steroid injections 2  times in June 2009, which was not particularly helpful; however, she did  get some relief with epidural steroids at L4-5 level, performed twice in  July 2008 and then once in August 2008, at the L5-S1 level.   Her pain is described as sharp, burning, constant aching, tingling,  stabbing.  She has pain inhibition scores of 4/10.  Her pain does  improve with medications as well as back injections, does increase with  walking, bending, sitting, and sometimes standing.  She can climb steps.  She does not drive.  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  She  has good days and bad days and on her bad days had difficulty with  dressing, bathing, and meal prep.  She has problems with household  duties and shopping as well.   REVIEW OF SYSTEMS:  Positive for numbness, tingling, trouble walking,  spasms, depression.  There is limb swelling, night sweats.   PAST MEDICAL HISTORY:  Hypertension and arthritis.   SOCIAL HISTORY:  Married.   FAMILY HISTORY:  Diabetes and high blood  pressure.   OTHER PAST MEDICAL HISTORY:  Significant for fibromyalgia, hypertension,  GERD, and asthma.   PHYSICAL EXAMINATION:  VITAL SIGNS:  Blood pressure 159/88, pulse 63,  respirations 18, O2 sat 98% room air.  GENERAL:  No acute distress.  Orientation x3.  Affect is alert.  Gait is  normal.  NECK:  Her neck range of motion is 75% range, forward flexion,  extension, lateral rotation, bending, lumbar range of motion is 25%  range, forward flexion, extension, lateral rotation, and bending.  She  has tenderness even with very light palpation in the neck area as well  as low back area and buttock area.  She has normal strength, bilateral  deltoid, biceps, triceps, grip; however, when she puts her hands behind  her head, she has to go through some maneuvers where she keeps her hands  out and firm but more to bring her arms up and down.  EXTREMITIES:  Her lower extremity range of motion is normal.  No knee  crepitus.  No knee effusion.  No erythema in the joints of the upper or  lower extremities.   Sensation is intact.  Coordination is normal.  Deep tendon reflexes are  normal.   Review of lumbar MRI dated March 08, 2008, showed some mild-to-  moderate facet degeneration at L4-5  level, but disks really look normal  at all lumbar levels.  I reviewed this on E-chart.   IMPRESSION:  1. Lumbar pain, the primary pain complaint.  I do not think she has      any significant diskogenic disease.  I do think she may have some      facet arthropathy with lumbosacral spondylosis causing pain and      this is amplified by her underlying diagnosis of fibromyalgia      syndrome.  As I discussed with the patient, Physical Therapy is the      primary means of treatment, we can also do medial branch blocks for      further assessment in terms relative contribution of her facet      mediated pain to her overall back pain syndrome.  Certainly much of      her pain is fibromyalgia related and for  this reason would not use      around-the-clock opioids, I did indicate that I would not be      prescribing OxyContin for her, may consider t.i.d. dosing on the      hydrocodone; however, which continue her Cymbalta and consider      addition and we will add on some Robaxin 500 b.i.d.  2. Neck pain.  I believe this is more fibromyalgia related, no further      workup indicated at this time.  3. Knee pain, question knee degenerative joint disease.  Certainly,      would not be surprised given her weight, given that this is      secondary complaint, will hold off on further imaging studies at      this time.      Erick Colace, M.D.  Electronically Signed     AEK/MedQ  D:  05/16/2008 15:04:26  T:  05/17/2008 04:30:58  Job #:  [id number]   cc:   Erle Crocker, M.D.   Vickki Hearing, M.D.  Fax: [phone]

## 2010-09-04 NOTE — H&P (Signed)
Katherine Barry, Katherine Barry                ACCOUNT NO.:  [id number]   MEDICAL RECORD NO.:  [id number]          PATIENT TYPE:  AMB   LOCATION:  DAY                           FACILITY:  [facility]   PHYSICIAN:  Vickki Hearing, M.D.DATE OF BIRTH:  [birth year]   DATE OF ADMISSION:  DATE OF DISCHARGE:  [facility]                              HISTORY & PHYSICAL   CHIEF COMPLAINT:  Right upper extremity carpal tunnel syndrome.   HISTORY:  This is a 50 year old female with pain and swelling in both  hands, no injury present; for 5 years, pain is constant, associated with  aching, numbness, tingling, radiation to the midforearm, associated with  decreased grip and poor ability to perform fine motor tasks.  She was  treated with over-the-counter Tylenol, braces, Neurontin 100 mg b.i.d.  and we also got a nerve study.  On followup, the pain was not any  better.  The nerve study indicated carpal tunnel syndrome, right median  neuropathy moderate, left median neuropathy mild.   REVIEW OF SYSTEMS:  Poor circulation, migraines, numbness, weakness, bee  sting allergies.  Denies weight loss, shortness of breath, nausea,  vomiting, diarrhea, kidney disease failure, thyroid disease, goiter,  depression, eczema and lymph node cancer.  No allergies.  Denies medical  problems.   SURGERIES:  Include cholecystectomy and hysterectomy.   FAMILY HISTORY:  Heart disease, arthritis and diabetes.   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  Educational  level:  Grade 12.   PHYSICAL EXAMINATION:  VITAL SIGNS:  Weight 248, pulse is 90,  respiratory rate 18.  GENERAL:  Appearance of the weight, otherwise normal.  CARDIOVASCULAR:  Observation and palpation were normal.  SKIN:  No scars.  MUSCULOSKELETAL:  Wrist range of motion was normal.  Stability was  normal.  Strength showed weakness to power grip.  Tenderness over the  carpal tunnel.  Reflexes 2+.  Sensation showed decreased median nerve  findings.  NEUROLOGIC:  She was alert and oriented, no depression or anxiety, or  agitation.   ADDENDUM:  We injected the carpal tunnel as well as a treatment option.   She did not improve with any of the nonoperative treatments, so we  recommend a carpal tunnel release.   We had informed consent process which included explanation of the  diagnosis, treatment options, treatment plan, the risks and benefits of  surgical versus nonsurgical treatment and the patient opted for surgical  treatment for carpal tunnel release of the right wrist.      Vickki Hearing, M.D.  Electronically Signed     SEH/MEDQ  D:  11/24/2006  T:  11/25/2006  Job:  [id number]   cc:   [facility]

## 2010-09-04 NOTE — Op Note (Signed)
[name], [name]                ACCOUNT NO.:  [id number]   MEDICAL RECORD NO.:  [id number]          PATIENT TYPE:  AMB   LOCATION:  DAY                           FACILITY:  [facility]   PHYSICIAN:  Vickki Hearing, M.D.DATE OF BIRTH:  [birth year]   DATE OF PROCEDURE:  11/25/2006  DATE OF DISCHARGE:                               OPERATIVE REPORT   PREOPERATIVE DIAGNOSIS:  Carpal tunnel syndrome right wrist.   POSTOPERATIVE DIAGNOSIS:  Carpal tunnel syndrome right wrist.   PROCEDURE:  Right carpal tunnel release, open technique.   SURGEON:  Vickki Hearing, M.D.   ASSISTANT:  No assistants.   ANESTHESIA:  Bier block.   OPERATIVE FINDINGS:  Compressed,discolored, gray-appearing median nerve.  No space-occupying lesions.   HISTORY:  This is a 50 year old female with right carpal tunnel  syndrome, treated conservatively.  Failed conservative treatment.  Had a  nerve conduction study and showed carpal tunnel syndrome.  She was  scheduled for carpal tunnel release.   DESCRIPTION OF PROCEDURE:  The patient identified as Katherine Barry, right  wrist marked for surgery, countersigned by the surgeon.  History and  physical updated. The patient was given antibiotics,  taken to surgery,  had Bier block.  Time-out procedure was completed.   Straight incision was made in the mid carpal area, subcutaneous tissue  divided down to palmar fascia.  Palmar fascia split in line with skin  incision.  Distal point of the median nerve of the transverse carpal  ligament was identified bluntly.  The transverse carpal ligament was  released over a protective retractor up through the medial through the  flexor retinaculum.   Wound was irrigated and closed with 3-0 nylon suture then injected with  10 mL of plain Marcaine.   Sterile dressing applied.  The patient taken to the recovery room in  stable condition.  Sterile dressing applied.      Vickki Hearing, M.D.  Electronically  Signed     SEH/MEDQ  D:  11/25/2006  T:  11/25/2006  Job:  [id number]

## 2011-02-04 LAB — HEMOGLOBIN AND HEMATOCRIT, BLOOD: Hemoglobin: 13.3

## 2011-02-04 LAB — BASIC METABOLIC PANEL
BUN: 13
Calcium: 9.2
GFR calc non Af Amer: >60

## 2012-08-18 ENCOUNTER — Ambulatory Visit: Payer: Self-pay | Admitting: Orthopedic Surgery

## 2012-08-18 ENCOUNTER — Encounter: Payer: Self-pay | Admitting: Orthopedic Surgery

## 2013-07-15 ENCOUNTER — Ambulatory Visit: Payer: Self-pay | Admitting: Orthopedic Surgery

## 2013-07-16 ENCOUNTER — Encounter: Payer: Self-pay | Admitting: Orthopedic Surgery

## 2017-06-17 ENCOUNTER — Encounter (HOSPITAL_COMMUNITY): Payer: Self-pay | Admitting: Emergency Medicine

## 2017-06-17 ENCOUNTER — Emergency Department (HOSPITAL_COMMUNITY)
Admission: EM | Admit: 2017-06-17 | Discharge: 2017-06-17 | Disposition: A | Discharge status: Home / Self Care | Payer: BLUE CROSS/BLUE SHIELD | Attending: Emergency Medicine | Admitting: Emergency Medicine

## 2017-06-17 ENCOUNTER — Other Ambulatory Visit: Payer: Self-pay

## 2017-06-17 DIAGNOSIS — M5442 Lumbago with sciatica, left side: Secondary | ICD-10-CM | POA: Insufficient documentation

## 2017-06-17 DIAGNOSIS — M5441 Lumbago with sciatica, right side: Secondary | ICD-10-CM | POA: Insufficient documentation

## 2017-06-17 DIAGNOSIS — I509 Heart failure, unspecified: Secondary | ICD-10-CM | POA: Diagnosis not present

## 2017-06-17 DIAGNOSIS — J45909 Unspecified asthma, uncomplicated: Secondary | ICD-10-CM | POA: Diagnosis not present

## 2017-06-17 DIAGNOSIS — E119 Type 2 diabetes mellitus without complications: Secondary | ICD-10-CM | POA: Insufficient documentation

## 2017-06-17 DIAGNOSIS — M545 Low back pain: Secondary | ICD-10-CM | POA: Diagnosis present

## 2017-06-17 DIAGNOSIS — I110 Hypertensive heart disease with heart failure: Secondary | ICD-10-CM | POA: Insufficient documentation

## 2017-06-17 DIAGNOSIS — G8929 Other chronic pain: Secondary | ICD-10-CM

## 2017-06-17 HISTORY — DX: Essential (primary) hypertension: I10

## 2017-06-17 HISTORY — DX: Other pulmonary embolism without acute cor pulmonale: I26.99

## 2017-06-17 HISTORY — DX: Cerebral infarction, unspecified: I63.9

## 2017-06-17 HISTORY — DX: Type 2 diabetes mellitus without complications: E11.9

## 2017-06-17 HISTORY — DX: Other cervical disc degeneration, unspecified cervical region: M50.30

## 2017-06-17 MED ORDER — HYDROMORPHONE HCL 1 MG/ML IJ SOLN
1.0000 mg | Freq: Once | INTRAMUSCULAR | Status: AC
  Administered 2017-06-17: 1 mg via INTRAMUSCULAR
  Filled 2017-06-17: qty 1

## 2017-06-17 NOTE — ED Provider Notes (Signed)
[facility] EMERGENCY DEPARTMENT Provider Note   CSN: [id number] Arrival date & time: 06/17/17  1132     History   Chief Complaint Chief Complaint  Patient presents with  . Back Pain    HPI Katherine Barry is a 57 y.o. female.  HPI  Katherine Barry is a 57 y.o. female who presents to the Emergency Department complaining of chronic low back pain.  She describes back pain for more than 15 years, she has been taking OxyContin and states she has ran out of her pain medication and that her primary care provider will not refill her pain medications.  She states that she is currently awaiting an appointment with pain management.  She resides in [place] and is she is here visiting her daughter.  States she took her last OxyContin 5 days ago.  She describes a sharp pain to her mid lower back that radiates into both legs.  Pain is associated with weightbearing and with certain movements.  She denies fever, chills, numbness of her lower extremities, abdominal pain, urine or bowel changes.  She is tried over-the-counter medications without relief.  Past Medical History:  Diagnosis Date  . DDD (degenerative disc disease), cervical   . Diabetes mellitus without complication (HCC)   . Hypertension   . Pulmonary embolism (HCC)   . Stroke [facility](HCC)     Patient Active Problem List   Diagnosis Date Noted  . PAIN IN JOINT, MULTIPLE SITES 09/06/2009  . ANSERINE BURSITIS, LEFT 07/06/2009  . BURSITIS, RIGHT SHOULDER 06/12/2009  . IMPINGEMENT SYNDROME 06/12/2009  . POSTMENOPAUSAL OSTEOPOROSIS 05/04/2008  . EDEMA 05/04/2008  . ASTHMA, WITH ACUTE EXACERBATION 04/27/2008  . INSOMNIA 03/28/2008  . OBESITY 03/01/2008  . OTHER SPECIFIED DISORDERS OF THYROID 02/29/2008  . HYPERLIPIDEMIA 02/29/2008  . ANXIETY 02/29/2008  . HYPERTENSION 02/29/2008  . ABNORMAL HEART RHYTHMS 02/29/2008  . CONGESTIVE HEART FAILURE 02/29/2008  . ALLERGIC RHINITIS 02/29/2008  . ASTHMA 02/29/2008  . GERD 02/29/2008  .  UNSPECIFIED CONSTIPATION 02/29/2008  . OVERACTIVE BLADDER 02/29/2008  . MENOPAUSAL SYNDROME 02/29/2008  . OSTEOARTHRITIS 02/29/2008  . FIBROMYALGIA 02/29/2008  . MIGRAINES, HX OF 02/29/2008  . LOW BACK PAIN 09/08/2007    Past Surgical History:  Procedure Laterality Date  . CHOLECYSTECTOMY    . HAND SURGERY    . HYSTEROSCOPY      OB History    No data available       Home Medications    Prior to Admission medications   Not on File    Family History No family history on file.  Social History Social History   Tobacco Use  . Smoking status: Never Smoker  . Smokeless tobacco: Never Used  Substance Use Topics  . Alcohol use: Not on file  . Drug use: No     Allergies   Patient has no allergy information on record.   Review of Systems Review of Systems  Constitutional: Negative for fever.  Respiratory: Negative for shortness of breath.   Gastrointestinal: Negative for abdominal pain, constipation and vomiting.  Genitourinary: Negative for decreased urine volume, difficulty urinating, dysuria, flank pain and hematuria.  Musculoskeletal: Positive for back pain. Negative for joint swelling.  Skin: Negative for rash.  Neurological: Negative for weakness and numbness.  All other systems reviewed and are negative.    Physical Exam Updated Vital Signs BP (!) 151/104 (BP Location: Left Arm)   Pulse 77   Temp 98.2 F (36.8 C) (Oral)   Resp 16   Ht  5\' 6"  (1.676 m)   Wt 130.2 kg (287 lb)   SpO2 100%   BMI 46.32 kg/m   Physical Exam  Constitutional: She is oriented to person, place, and time. She appears well-developed and well-nourished. No distress.  HENT:  Head: Atraumatic.  Neck: Normal range of motion. Neck supple.  Cardiovascular: Normal rate, regular rhythm and intact distal pulses.  DP pulses are strong and palpable bilaterally  Pulmonary/Chest: Effort normal and breath sounds normal. No respiratory distress.  Abdominal: Soft. She exhibits no  distension. There is no tenderness.  Musculoskeletal: She exhibits tenderness. She exhibits no edema.       Lumbar back: She exhibits tenderness and pain. She exhibits normal range of motion, no swelling, no deformity, no laceration and normal pulse.  ttp of the lower lumbar spine and bilateral paraspinal muscles.  Pt has 5/5 strength against resistance of bilateral lower extremities.     Neurological: She is alert and oriented to person, place, and time. She has normal strength. No sensory deficit. She exhibits normal muscle tone. Coordination and gait normal.  Reflex Scores:      Patellar reflexes are 2+ on the right side and 2+ on the left side.      Achilles reflexes are 2+ on the right side and 2+ on the left side. Skin: Skin is warm and dry. Capillary refill takes less than 2 seconds. No rash noted.  Nursing note and vitals reviewed.    ED Treatments / Results  Labs (all labs ordered are listed, but only abnormal results are displayed) Labs Reviewed - No data to display  EKG  EKG Interpretation None       Radiology No results found.  Procedures Procedures (including critical care time)  Medications Ordered in ED Medications  HYDROmorphone (DILAUDID) injection 1 mg (1 mg Intramuscular Given 06/17/17 1504)     Initial Impression / Assessment and Plan / ED Course  I have reviewed the triage vital signs and the nursing notes.  Pertinent labs & imaging results that were available during my care of the patient were reviewed by me and considered in my medical decision making (see chart for details).     Pt with chronic low back pain.  Requesting rx for pain medications.  She is well appearing.  No withdrawal sx's.  NV intact, ambulates with steady gait.  No concerning sx's for cauda equina or spinal abscess.  Pt stable for d/c, verbalized understanding that narcotic pain medications are not indicated at this time.   Final Clinical Impressions(s) / ED Diagnoses   Final  diagnoses:  Chronic bilateral low back pain with bilateral sciatica    ED Discharge Orders    None       Rosey Bath 06/17/17 2118    Loren Racer, MD 06/20/17 1526

## 2017-06-17 NOTE — ED Triage Notes (Addendum)
Pt has chronic pain.  Has been on oxycodone slow and fast release x 20 years.  Pt states her pcp would not refill her pain meds yesterday, they told her she needed to go to a pain clinic.  Pt reports all over body pain currently.  Has not had any pain medication since Thursday.  Pt is here for refill on her pain medications

## 2017-06-17 NOTE — Discharge Instructions (Signed)
Follow-up with your pain management provider.  °

## 2019-08-05 LAB — HEMOGLOBIN A1C: Hemoglobin A1C: 11

## 2019-08-05 LAB — COMPREHENSIVE METABOLIC PANEL
GFR calc Af Amer: 99
GFR calc non Af Amer: 85

## 2019-08-05 LAB — BASIC METABOLIC PANEL
BUN: 13 (ref 4–21)
Creatinine: 0.8 (ref 0.5–1.1)

## 2019-08-05 LAB — VITAMIN D 25 HYDROXY (VIT D DEFICIENCY, FRACTURES): Vit D, 25-Hydroxy: 22.5

## 2019-09-30 ENCOUNTER — Ambulatory Visit: Payer: Self-pay | Admitting: "Endocrinology

## 2019-11-11 ENCOUNTER — Ambulatory Visit: Payer: Self-pay | Admitting: "Endocrinology

## 2019-11-15 ENCOUNTER — Other Ambulatory Visit: Payer: Self-pay

## 2019-11-15 ENCOUNTER — Encounter: Payer: Self-pay | Admitting: "Endocrinology

## 2019-11-15 ENCOUNTER — Ambulatory Visit (INDEPENDENT_AMBULATORY_CARE_PROVIDER_SITE_OTHER): Payer: BLUE CROSS/BLUE SHIELD | Admitting: "Endocrinology

## 2019-11-15 VITALS — BP 126/88 | HR 64

## 2019-11-15 DIAGNOSIS — E1165 Type 2 diabetes mellitus with hyperglycemia: Secondary | ICD-10-CM | POA: Diagnosis not present

## 2019-11-15 DIAGNOSIS — Z794 Long term (current) use of insulin: Secondary | ICD-10-CM | POA: Diagnosis not present

## 2019-11-15 DIAGNOSIS — E782 Mixed hyperlipidemia: Secondary | ICD-10-CM

## 2019-11-15 DIAGNOSIS — I10 Essential (primary) hypertension: Secondary | ICD-10-CM | POA: Diagnosis not present

## 2019-11-15 LAB — POCT GLYCOSYLATED HEMOGLOBIN (HGB A1C): Hemoglobin A1C: 10.9 % — AB (ref 4.0–5.6)

## 2019-11-15 MED ORDER — INSULIN ASPART 100 UNIT/ML FLEXPEN: 10.0000 [IU] | PEN_INJECTOR | Freq: Three times a day (TID) | SUBCUTANEOUS | 1 refills | Status: AC

## 2019-11-15 MED ORDER — ACCU-CHEK GUIDE W/DEVICE KIT
1.0000 | PACK | 0 refills | Status: DC
Start: 2019-11-15 — End: 2020-03-13

## 2019-11-15 MED ORDER — TRESIBA FLEXTOUCH 200 UNIT/ML ~~LOC~~ SOPN: 60.0000 [IU] | PEN_INJECTOR | Freq: Every day | SUBCUTANEOUS | 1 refills | Status: DC

## 2019-11-15 MED ORDER — METFORMIN HCL 500 MG PO TABS: 500.0000 mg | ORAL_TABLET | Freq: Two times a day (BID) | ORAL | 2 refills | Status: DC

## 2019-11-15 MED ORDER — ACCU-CHEK GUIDE VI STRP: ORAL_STRIP | 2 refills | Status: DC

## 2019-11-15 NOTE — Progress Notes (Signed)
Endocrinology Consult Note       11/15/2019, 1:02 PM   Subjective:    Patient ID: Katherine Barry, female    DOB: April 26, 1960.  Katherine Barry is being seen in consultation for management of currently uncontrolled symptomatic diabetes requested by  Emelda Fear, DO.   Past Medical History:  Diagnosis Date  . DDD (degenerative disc disease), cervical   . Diabetes mellitus without complication ([place])   . Hypertension   . Pulmonary embolism ([place])   . Stroke [facility])     Past Surgical History:  Procedure Laterality Date  . CHOLECYSTECTOMY    . HAND SURGERY    . HYSTEROSCOPY      Social History   Socioeconomic History  . Marital status: Married    Spouse name: Not on file  . Number of children: Not on file  . Years of education: Not on file  . Highest education level: Not on file  Occupational History  . Not on file  Tobacco Use  . Smoking status: Never Smoker  . Smokeless tobacco: Never Used  Vaping Use  . Vaping Use: Never used  Substance and Sexual Activity  . Alcohol use: Never  . Drug use: No  . Sexual activity: Not on file  Other Topics Concern  . Not on file  Social History Narrative  . Not on file   Social Determinants of Health   Financial Resource Strain:   . Difficulty of Paying Living Expenses:   Food Insecurity:   . Worried About Charity fundraiser in the Last Year:   . Arboriculturist in the Last Year:   Transportation Needs:   . Film/video editor (Medical):   Marland Kitchen Lack of Transportation (Non-Medical):   Physical Activity:   . Days of Exercise per Week:   . Minutes of Exercise per Session:   Stress:   . Feeling of Stress :   Social Connections:   . Frequency of Communication with Friends and Family:   . Frequency of Social Gatherings with Friends and Family:   . Attends Religious Services:   . Active Member of Clubs or Organizations:   . Attends Theatre manager Meetings:   Marland Kitchen Marital Status:     Family History  Problem Relation Age of Onset  . Diabetes Mother   . Hypertension Mother   . Diabetes Father   . Hypertension Father   . Heart attack Father   . Stroke Father   . Kidney disease Father     Outpatient Encounter Medications as of 11/15/2019  Medication Sig  . albuterol (VENTOLIN HFA) 108 (90 Base) MCG/ACT inhaler Inhale 2 puffs into the lungs 2 (two) times daily.  Marland Kitchen ipratropium (ATROVENT) 0.03 % nasal spray use in nebulizer qid  . pantoprazole (PROTONIX) 40 MG tablet Take 40 mg by mouth daily.  . potassium chloride SA (KLOR-CON) 20 MEQ tablet Take 20 mEq by mouth daily.  . [DISCONTINUED] Ascorbic Acid 500 MG CAPS Take 1 capsule by mouth 2 (two) times daily.  . [DISCONTINUED] Dulaglutide (TRULICITY) 4.69 GE/9.5MW SOPN Inject 0.75 mg into the skin once a week.  . [  DISCONTINUED] Fluticasone-Umeclidin-Vilant (TRELEGY ELLIPTA) 100-62.5-25 MCG/INH AEPB Inhale into the lungs.  . [DISCONTINUED] insulin NPH Human (NOVOLIN N) 100 UNIT/ML injection Inject 60 Units into the skin 2 (two) times daily before a meal.  . [DISCONTINUED] metFORMIN (GLUCOPHAGE) 500 MG tablet Take by mouth 2 (two) times daily with a meal.  . amitriptyline (ELAVIL) 50 MG tablet Take 50 mg by mouth at bedtime.  . Blood Glucose Monitoring Suppl (ACCU-CHEK GUIDE) w/Device KIT 1 Piece by Does not apply route as directed.  . bumetanide (BUMEX) 2 MG tablet Take 2 mg by mouth 2 (two) times daily.  . cyclobenzaprine (FLEXERIL) 10 MG tablet Take 10 mg by mouth 2 (two) times daily.  Marland Kitchen ELIQUIS 5 MG TABS tablet Take 5 mg by mouth 2 (two) times daily.  . furosemide (LASIX) 40 MG tablet Take 40 mg by mouth daily as needed.  . gabapentin (NEURONTIN) 800 MG tablet Take 800 mg by mouth 3 (three) times daily.  Marland Kitchen glucose blood (ACCU-CHEK GUIDE) test strip Use as instructed  . insulin aspart (NOVOLOG) 100 UNIT/ML FlexPen Inject 10-16 Units into the skin 3 (three) times daily with  meals.  . insulin degludec (TRESIBA FLEXTOUCH) 200 UNIT/ML FlexTouch Pen Inject 60 Units into the skin at bedtime.  . meclizine (ANTIVERT) 25 MG tablet Take 25 mg by mouth 3 (three) times daily as needed.  . meloxicam (MOBIC) 15 MG tablet Take 15 mg by mouth daily.  . metFORMIN (GLUCOPHAGE) 500 MG tablet Take 1 tablet (500 mg total) by mouth 2 (two) times daily with a meal.  . oxyCODONE-acetaminophen (PERCOCET) 10-325 MG tablet Take 1 tablet by mouth 4 (four) times daily as needed.  . Vitamin D, Ergocalciferol, (DRISDOL) 1.25 MG (50000 UNIT) CAPS capsule Take 50,000 Units by mouth once a week.  . [DISCONTINUED] buPROPion (WELLBUTRIN XL) 150 MG 24 hr tablet Take 150 mg by mouth daily.  . [DISCONTINUED] escitalopram (LEXAPRO) 10 MG tablet Take 1 tablet by mouth daily.  . [DISCONTINUED] HUMULIN R 100 UNIT/ML injection Inject 5 Units into the skin 3 (three) times daily. Inject 5 units three times a day if blood sugar greater than 250  . [DISCONTINUED] lisinopril (ZESTRIL) 40 MG tablet Take 40 mg by mouth daily.  . [DISCONTINUED] losartan (COZAAR) 50 MG tablet Take 50 mg by mouth daily.  . [DISCONTINUED] metoprolol tartrate (LOPRESSOR) 100 MG tablet Take 100 mg by mouth 2 (two) times daily.  . [DISCONTINUED] naproxen (NAPROSYN) 500 MG tablet Take 500 mg by mouth 2 (two) times daily.  . [DISCONTINUED] pravastatin (PRAVACHOL) 40 MG tablet Take 40 mg by mouth at bedtime.   No facility-administered encounter medications on file as of 11/15/2019.    ALLERGIES: Allergies  Allergen Reactions  . Baclofen     VACCINATION STATUS: Immunization History  Administered Date(s) Administered  . Influenza Whole 02/08/2008  . Pneumococcal Polysaccharide-23 03/28/2008    Diabetes She presents for her initial diabetic visit. She has type 2 diabetes mellitus. Onset time: Patient was diagnosed at approximate age of 59 years. Her disease course has been worsening. There are no hypoglycemic associated symptoms.  Pertinent negatives for hypoglycemia include no confusion, headaches, pallor or seizures. Associated symptoms include fatigue, polydipsia and polyuria. Pertinent negatives for diabetes include no chest pain and no polyphagia. There are no hypoglycemic complications. Symptoms are worsening. Diabetic complications include a CVA, heart disease and peripheral neuropathy. Risk factors for coronary artery disease include diabetes mellitus, dyslipidemia, family history, obesity, hypertension, post-menopausal and sedentary lifestyle. Current diabetic treatment includes  insulin injections (She is currently on regular insulin 5 units 3 times daily, NPH 60 units twice daily.). Her weight is increasing steadily. She is following a generally unhealthy diet. When asked about meal planning, she reported none. She has not had a previous visit with a dietitian. She never (Patient is wheelchair-bound due to previous CVA, deconditioning, diffuse arthritis.) participates in exercise. (Patient did not bring any logs nor meter to review.  Her point-of-care A1c was 10.9% remains high compared to March 2021 when he was 11.1%.) An ACE inhibitor/angiotensin II receptor blocker is being taken. Eye exam is not current.  Hyperlipidemia This is a chronic problem. The current episode started more than 1 year ago. Exacerbating diseases include diabetes and obesity. Pertinent negatives include no chest pain, myalgias or shortness of breath. Risk factors for coronary artery disease include family history, dyslipidemia, diabetes mellitus, hypertension, obesity, a sedentary lifestyle and post-menopausal.  Hypertension This is a chronic problem. The current episode started more than 1 year ago. Pertinent negatives include no chest pain, headaches, palpitations or shortness of breath. Risk factors for coronary artery disease include diabetes mellitus, dyslipidemia, obesity, sedentary lifestyle, post-menopausal state and family history. Hypertensive  end-organ damage includes CVA.     Review of Systems  Constitutional: Positive for fatigue. Negative for chills, fever and unexpected weight change.  HENT: Negative for trouble swallowing and voice change.   Eyes: Negative for visual disturbance.  Respiratory: Negative for cough, shortness of breath and wheezing.   Cardiovascular: Negative for chest pain, palpitations and leg swelling.  Gastrointestinal: Negative for diarrhea, nausea and vomiting.  Endocrine: Positive for polydipsia and polyuria. Negative for cold intolerance, heat intolerance and polyphagia.  Musculoskeletal: Positive for gait problem. Negative for arthralgias and myalgias.  Skin: Negative for color change, pallor, rash and wound.  Neurological: Negative for seizures and headaches.  Psychiatric/Behavioral: Negative for confusion and suicidal ideas.    Objective:    Vitals with BMI 11/15/2019 06/17/2017 06/17/2017  Height - - _0   Weight - - 287 lbs  BMI - - 08.67  Systolic 619 509 -  Diastolic 88 326 -  Pulse 64 77 -    BP (!) 126/88   Pulse 64   Wt Readings from Last 3 Encounters:  06/17/17 287 lb (130.2 kg)     Physical Exam Constitutional:      Appearance: She is well-developed.  HENT:     Head: Normocephalic and atraumatic.  Neck:     Thyroid: No thyromegaly.     Trachea: No tracheal deviation.  Cardiovascular:     Rate and Rhythm: Normal rate and regular rhythm.  Pulmonary:     Effort: Pulmonary effort is normal.     Breath sounds: Normal breath sounds.  Abdominal:     General: Bowel sounds are normal.     Tenderness: There is no abdominal tenderness. There is no guarding.  Musculoskeletal:        General: Normal range of motion.     Cervical back: Normal range of motion and neck supple.  Skin:    General: Skin is warm and dry.     Coloration: Skin is not pale.     Findings: No erythema or rash.  Neurological:     Mental Status: She is alert and oriented to person, place, and time.      Cranial Nerves: No cranial nerve deficit.     Coordination: Coordination normal.     Deep Tendon Reflexes: Reflexes are normal and symmetric.  Psychiatric:  Judgment: Judgment normal.       CMP ( most recent) CMP     Component Value Date/Time   NA 141 09/19/2009 1111   K 4.0 09/19/2009 1111   CL 104 09/19/2009 1111   CO2 31 09/19/2009 1111   GLUCOSE 109 (H) 09/19/2009 1111   BUN 13 08/05/2019 0000   CREATININE 0.8 08/05/2019 0000   CREATININE 0.86 09/19/2009 1111   CALCIUM 9.4 09/19/2009 1111   PROT 6.9 04/27/2008 1242   ALBUMIN 3.6 04/27/2008 1242   AST 23 04/27/2008 1242   ALT 17 04/27/2008 1242   ALKPHOS 74 04/27/2008 1242   BILITOT 0.7 04/27/2008 1242   GFRNONAA 85 08/05/2019 0000   GFRAA 99 08/05/2019 0000     Diabetic Labs (most recent): Lab Results  Component Value Date   HGBA1C 10.9 (A) 11/15/2019   HGBA1C 11.0 08/05/2019      Assessment & Plan:   1. Type 2 diabetes mellitus with hyperglycemia, with long-term current use of insulin ([place])  - MCKENZEE BEEM has currently uncontrolled symptomatic type 2 DM since  59 years of age,  with most recent A1c of 10.9 %. Recent labs reviewed. - I had a long discussion with her about the progressive nature of diabetes and the pathology behind its complications. -her diabetes is complicated by obesity/sedentary life, current diabetes, CVA, CHF  and she remains at a high risk for more acute and chronic complications which include CAD, CVA, CKD, retinopathy, and neuropathy. These are all discussed in detail with her.  - I have counseled her on diet  and weight management  by adopting a carbohydrate restricted/protein rich diet. Patient is encouraged to switch to  unprocessed or minimally processed     complex starch and increased protein intake (animal or plant source), fruits, and vegetables. -  she is advised to stick to a routine mealtimes to eat 3 meals  a day and avoid unnecessary snacks ( to snack only to  correct hypoglycemia).   - she admits that there is a room for improvement in her food and drink choices. - Suggestion is made for her to avoid simple carbohydrates  from her diet including Cakes, Sweet Desserts, Ice Cream, Soda (diet and regular), Sweet Tea, Candies, Chips, Cookies, Store Bought Juices, Alcohol in Excess of  1-2 drinks a day, Artificial Sweeteners,  Coffee Creamer, and "Sugar-free" Products. This will help patient to have more stable blood glucose profile and potentially avoid unintended weight gain.  - she will be scheduled with Jearld Fenton, RDN, CDE for diabetes education.  - I have approached her with the following individualized plan to manage  her diabetes and patient agrees:   -Given her chronic glycemic burden, she will benefit from insulin analogs instead of regular insulin Novolin.   -I discussed and initiated Tresiba 60 units nightly, NovoLog 10 units 3 times a day with meals  for pre-meal BG readings of 90-146m/dl, plus patient specific correction dose for unexpected hyperglycemia above 1522mdl, associated with strict monitoring of glucose 4 times a day-before meals and at bedtime. - she is warned not to take insulin without proper monitoring per orders. - Adjustment parameters are given to her for hypo and hyperglycemia in writing. - she is encouraged to call clinic for blood glucose levels less than 70 or above 300 mg /dl. - she her renal function within normal range, will benefit from low-dose Metformin treatment.  I discussed and added Metformin 500 mg p.o. twice daily after breakfast and supper.   -  she will be considered for incretin therapy as appropriate next visit.  - Specific targets for  A1c;  LDL, HDL,  and Triglycerides were discussed with the patient.  2) Blood Pressure /Hypertension:  her blood pressure is  controlled to target.   she was supposed to be on lisinopril, as it look like patient is taking the medication patient advised to bring all her  medications for consideration next visit.     3) Lipids/Hyperlipidemia: No recent lipid panel to review.  She was supposed to be pravastatin 40 mg p.o. nightly, does not look like she is taking this medication longer.  She is advised to bring in her medication for reconciliation.    4)  Weight/Diet: She weighs 287 pounds today, clearly complicating her diabetes care.   she is  a candidate for weight loss. I discussed with her the fact that loss of 5 - 10% of her  current body weight will have the most impact on her diabetes management.  Exercise, and detailed carbohydrates information provided  -  detailed on discharge instructions.  5) vitamin D deficiency: She is advised to continue vitamin D2 50,000 units weekly.  6) Chronic Care/Health Maintenance:  -she  Is not on ACEI/ARB and Statin medications and  is encouraged to initiate and continue to follow up with Ophthalmology, Dentist,  Podiatrist at least yearly or according to recommendations, and advised to stay away from smoking. I have recommended yearly flu vaccine and pneumonia vaccine at least every 5 years; moderate intensity exercise for up to 150 minutes weekly; and  sleep for at least 7 hours a day.  - she is  advised to maintain close follow up with Emelda Fear, DO for primary care needs, as well as her other providers for optimal and coordinated care.   - Time spent in this patient care: 60 min, of which > 50% was spent in  counseling  her about her  chronically uncontrolled type 2 diabetes, complicated by CHF, CAD , CVA; hyperlipidemia, hypertension and the rest reviewing her blood glucose logs , discussing her hypoglycemia and hyperglycemia episodes, reviewing her current and  previous labs / studies  ( including abstraction from other facilities) and medications  doses and developing a  long term treatment plan based on the latest standards of care/ guidelines; and documenting her care.    Please refer to Patient Instructions for  Blood Glucose Monitoring and Insulin/Medications Dosing Guide"  in media tab for additional information. Please  also refer to " Patient Self Inventory" in the Media  tab for reviewed elements of pertinent patient history.  Vinnie Level participated in the discussions, expressed understanding, and voiced agreement with the above plans.  All questions were answered to her satisfaction. she is encouraged to contact clinic should she have any questions or concerns prior to her return visit.   Follow up plan: - Return in about 10 days (around 11/25/2019) for F/U with Meter and Logs Only - no Labs.  Glade Lloyd, MD [facility] [address] [place], [place] [address] Phone: [phone]  Fax: [phone]    11/15/2019, 1:02 PM  This note was partially dictated with voice recognition software. Similar sounding words can be transcribed inadequately or may not  be corrected upon review.

## 2019-11-15 NOTE — Patient Instructions (Signed)

## 2019-11-24 ENCOUNTER — Ambulatory Visit: Payer: BLUE CROSS/BLUE SHIELD | Admitting: "Endocrinology

## 2019-12-29 ENCOUNTER — Ambulatory Visit: Payer: BLUE CROSS/BLUE SHIELD | Admitting: Nutrition

## 2020-01-04 ENCOUNTER — Ambulatory Visit: Payer: BLUE CROSS/BLUE SHIELD | Admitting: Nutrition

## 2020-02-09 ENCOUNTER — Other Ambulatory Visit: Payer: Self-pay | Admitting: "Endocrinology

## 2020-03-13 ENCOUNTER — Ambulatory Visit (INDEPENDENT_AMBULATORY_CARE_PROVIDER_SITE_OTHER): Payer: BLUE CROSS/BLUE SHIELD | Admitting: "Endocrinology

## 2020-03-13 ENCOUNTER — Encounter: Payer: Self-pay | Admitting: Nutrition

## 2020-03-13 ENCOUNTER — Other Ambulatory Visit: Payer: Self-pay

## 2020-03-13 ENCOUNTER — Encounter: Payer: Self-pay | Admitting: "Endocrinology

## 2020-03-13 ENCOUNTER — Encounter: Payer: BLUE CROSS/BLUE SHIELD | Attending: Family Medicine | Admitting: Nutrition

## 2020-03-13 VITALS — Ht 66.0 in | Wt 325.0 lb

## 2020-03-13 VITALS — BP 102/74 | HR 64 | Ht 65.0 in | Wt 315.0 lb

## 2020-03-13 DIAGNOSIS — E782 Mixed hyperlipidemia: Secondary | ICD-10-CM | POA: Diagnosis not present

## 2020-03-13 DIAGNOSIS — I10 Essential (primary) hypertension: Secondary | ICD-10-CM | POA: Insufficient documentation

## 2020-03-13 DIAGNOSIS — Z794 Long term (current) use of insulin: Secondary | ICD-10-CM | POA: Diagnosis not present

## 2020-03-13 DIAGNOSIS — Z9119 Patient's noncompliance with other medical treatment and regimen: Secondary | ICD-10-CM

## 2020-03-13 DIAGNOSIS — E1165 Type 2 diabetes mellitus with hyperglycemia: Secondary | ICD-10-CM | POA: Insufficient documentation

## 2020-03-13 DIAGNOSIS — Z91199 Patient's noncompliance with other medical treatment and regimen due to unspecified reason: Secondary | ICD-10-CM

## 2020-03-13 LAB — POCT GLYCOSYLATED HEMOGLOBIN (HGB A1C): HbA1c, POC (controlled diabetic range): 12.6 % — AB (ref 0.0–7.0)

## 2020-03-13 MED ORDER — ACCU-CHEK GUIDE VI STRP: ORAL_STRIP | 2 refills | Status: AC

## 2020-03-13 MED ORDER — ACCU-CHEK GUIDE ME W/DEVICE KIT: 1.0000 | PACK | 0 refills | Status: AC

## 2020-03-13 NOTE — Patient Instructions (Signed)

## 2020-03-13 NOTE — Progress Notes (Signed)
Medical Nutrition Therapy  Walk in from Dr. Kandee Keen Endocrinology. Will address safety questions next visit. Here with Fay Records, First cousin. He helps out with meals, shopping for groceries and her medications.   Primary concerns today: Type 2 DM.  Referral diagnosis: E11. 65, Z79.4 Preferred learning style:  , no preference indicated learning readiness:  Ready   Safety questions will be addressed at next visit.  NUTRITION ASSESSMENT   Anthropometrics  Vitals with BMI 03/13/2020  Height 5\' 5"   Weight 315 lbs  BMI 52.42  Systolic 102  Diastolic 74  Pulse 64    Clinical Medical Hx: HTN, morbid obesity, Hyperlipidemia, CHF Medications: Tresiba 60, Novolog 10 units with meals plus sliding scale Metformin 500 mg BID. Labs:  Lab Results  Component Value Date   HGBA1C 12.6 (A) 03/13/2020   CMP Latest Ref Rng & Units 08/05/2019 09/19/2009 04/27/2008  Glucose 70 - 99 mg/dL - 06/25/2008) 94  BUN 4 - 21 13 9 10   Creatinine 0.5 - 1.1 0.8 0.86 0.63  Sodium 135 - 145 mEq/L - 141 137  Potassium 3.5 - 5.1 mEq/L - 4.0 3.8  Chloride 96 - 112 mEq/L - 104 103  CO2 19 - 32 mEq/L - 31 28  Calcium 8.4 - 10.5 mg/dL - 9.4 8.9  Total Protein 6.0 - 8.3 g/dL - - 6.9  Total Bilirubin 0.3 - 1.2 mg/dL - - 0.7  Alkaline Phos 39 - 117 units/L - - 74  AST 0 - 37 units/L - - 23  ALT 0 - 35 units/L - - 17    Notable Signs/Symptoms:   Lifestyle & Dietary Hx    NUTRITION DIAGNOSIS  Uncontrolled Diabetes Type 2 and Morbid obesity related to excessive calorie intake  as evidenced by BMI > 40 and A1C 12.6%    NUTRITION INTERVENTION  Nutrition education (E-1) on the following topics:   Diabetes  Weight loss tips  Benefits of chair exercises  Treatment of Hypo/hyperglycemi  Handouts Provided Include   The Plate Method   Diabetes Instructions.   Learning Style & Readiness for Change Teaching method utilized: Visual & Auditory  Demonstrated degree of understanding via: Teach  Back  Barriers to learning/adherence to lifestyle change: morbid obesity  Goals Established by Pt  Eat three meals per day  Take insulin as prescribed  Don't skip meals  Drink only water  Don't eat between meals or after supper- 7 pm.  Test blood sugars   MONITORING & EVALUATION Dietary intake, weekly physical activity, and follow up 1 week.  Next Steps  Patient is to Check blood sugars 4 times a day, use sliding scale and bring back in 1 week to see me and Dr. [phone](J.  Record blood sugars on sheets. 

## 2020-03-13 NOTE — Progress Notes (Signed)
03/13/2020, 12:55 PM   Endocrinology follow-up note  Subjective:    Patient ID: Katherine Barry, female    DOB: 07/24/1960.  AYUSHI PLA is being seen in follow up after she was seen in  consultation for management of currently uncontrolled symptomatic diabetes requested by  Emelda Fear, DO.   Past Medical History:  Diagnosis Date  . DDD (degenerative disc disease), cervical   . Diabetes mellitus without complication ([place])   . Hypertension   . Pulmonary embolism ([place])   . Stroke [facility])     Past Surgical History:  Procedure Laterality Date  . CHOLECYSTECTOMY    . HAND SURGERY    . HYSTEROSCOPY      Social History   Socioeconomic History  . Marital status: Married    Spouse name: Not on file  . Number of children: Not on file  . Years of education: Not on file  . Highest education level: Not on file  Occupational History  . Not on file  Tobacco Use  . Smoking status: Never Smoker  . Smokeless tobacco: Never Used  Vaping Use  . Vaping Use: Never used  Substance and Sexual Activity  . Alcohol use: Never  . Drug use: No  . Sexual activity: Not on file  Other Topics Concern  . Not on file  Social History Narrative  . Not on file   Social Determinants of Health   Financial Resource Strain:   . Difficulty of Paying Living Expenses: Not on file  Food Insecurity:   . Worried About Charity fundraiser in the Last Year: Not on file  . Ran Out of Food in the Last Year: Not on file  Transportation Needs:   . Lack of Transportation (Medical): Not on file  . Lack of Transportation (Non-Medical): Not on file  Physical Activity:   . Days of Exercise per Week: Not on file  . Minutes of Exercise per Session: Not on file  Stress:   . Feeling of Stress : Not on file  Social Connections:   . Frequency of Communication with Friends and Family: Not on file  . Frequency of Social  Gatherings with Friends and Family: Not on file  . Attends Religious Services: Not on file  . Active Member of Clubs or Organizations: Not on file  . Attends Archivist Meetings: Not on file  . Marital Status: Not on file    Family History  Problem Relation Age of Onset  . Diabetes Mother   . Hypertension Mother   . Diabetes Father   . Hypertension Father   . Heart attack Father   . Stroke Father   . Kidney disease Father     Outpatient Encounter Medications as of 03/13/2020  Medication Sig  . albuterol (VENTOLIN HFA) 108 (90 Base) MCG/ACT inhaler Inhale 2 puffs into the lungs 2 (two) times daily.  . Blood Glucose Monitoring Suppl (ACCU-CHEK GUIDE ME) w/Device KIT 1 Piece by Does not apply route as directed.  . bumetanide (BUMEX) 2 MG tablet Take 2 mg by mouth 2 (two) times daily.  Marland Kitchen ELIQUIS 5  MG TABS tablet Take 5 mg by mouth 2 (two) times daily.  . furosemide (LASIX) 40 MG tablet Take 40 mg by mouth daily as needed.  . gabapentin (NEURONTIN) 800 MG tablet Take 800 mg by mouth 3 (three) times daily.  Marland Kitchen glucose blood (ACCU-CHEK GUIDE) test strip Use as instructed  . insulin aspart (NOVOLOG) 100 UNIT/ML FlexPen Inject 10-16 Units into the skin 3 (three) times daily with meals.  . insulin degludec (TRESIBA FLEXTOUCH) 200 UNIT/ML FlexTouch Pen Inject 60 Units into the skin at bedtime.  . meclizine (ANTIVERT) 25 MG tablet Take 25 mg by mouth 3 (three) times daily as needed.  . metFORMIN (GLUCOPHAGE) 500 MG tablet TAKE 1 TABLET (500 MG TOTAL) BY MOUTH 2 (TWO) TIMES DAILY WITH A MEAL. (Patient not taking: Reported on 03/13/2020)  . metoprolol tartrate (LOPRESSOR) 100 MG tablet Take 100 mg by mouth 2 (two) times daily.  . pantoprazole (PROTONIX) 40 MG tablet Take 40 mg by mouth daily.  . potassium chloride SA (KLOR-CON) 20 MEQ tablet Take 20 mEq by mouth daily.  . Vitamin D, Ergocalciferol, (DRISDOL) 1.25 MG (50000 UNIT) CAPS capsule Take 50,000 Units by mouth once a week.  .  [DISCONTINUED] amitriptyline (ELAVIL) 50 MG tablet Take 50 mg by mouth at bedtime.  . [DISCONTINUED] Blood Glucose Monitoring Suppl (ACCU-CHEK GUIDE) w/Device KIT 1 Piece by Does not apply route as directed.  . [DISCONTINUED] cyclobenzaprine (FLEXERIL) 10 MG tablet Take 10 mg by mouth 2 (two) times daily.  . [DISCONTINUED] glucose blood (ACCU-CHEK GUIDE) test strip Use as instructed  . [DISCONTINUED] ipratropium (ATROVENT) 0.03 % nasal spray use in nebulizer qid  . [DISCONTINUED] meloxicam (MOBIC) 15 MG tablet Take 15 mg by mouth daily.  . [DISCONTINUED] oxyCODONE-acetaminophen (PERCOCET) 10-325 MG tablet Take 1 tablet by mouth 4 (four) times daily as needed.   No facility-administered encounter medications on file as of 03/13/2020.    ALLERGIES: Allergies  Allergen Reactions  . Baclofen     VACCINATION STATUS: Immunization History  Administered Date(s) Administered  . Influenza Whole 02/08/2008  . Pneumococcal Polysaccharide-23 03/28/2008    Diabetes She presents for her follow-up diabetic visit. She has type 2 diabetes mellitus. Onset time: Patient was diagnosed at approximate age of 20 years. Her disease course has been worsening. There are no hypoglycemic associated symptoms. Pertinent negatives for hypoglycemia include no confusion, headaches, pallor or seizures. Associated symptoms include fatigue, polydipsia and polyuria. Pertinent negatives for diabetes include no chest pain and no polyphagia. There are no hypoglycemic complications. Symptoms are worsening. Diabetic complications include a CVA, heart disease and peripheral neuropathy. Risk factors for coronary artery disease include diabetes mellitus, dyslipidemia, family history, obesity, hypertension, post-menopausal and sedentary lifestyle. Current diabetic treatment includes insulin injections (She is currently on regular insulin 5 units 3 times daily, NPH 60 units twice daily.). Her weight is increasing steadily. She is  following a generally unhealthy diet. When asked about meal planning, she reported none. She has not had a previous visit with a dietitian. She never (Patient is wheelchair-bound due to previous CVA, deconditioning, diffuse arthritis.) participates in exercise. There is no compliance with monitoring of blood glucose. (Patient brought in a piece of paper which showed rare, random glucose . Her POC a1c is 12.6% worsening from 10.9%. -she does not report hypoglycemia. ) An ACE inhibitor/angiotensin II receptor blocker is being taken. Eye exam is not current.  Hyperlipidemia This is a chronic problem. The current episode started more than 1 year ago. Exacerbating diseases include  diabetes and obesity. Pertinent negatives include no chest pain, myalgias or shortness of breath. Risk factors for coronary artery disease include family history, dyslipidemia, diabetes mellitus, hypertension, obesity, a sedentary lifestyle and post-menopausal.  Hypertension This is a chronic problem. The current episode started more than 1 year ago. Pertinent negatives include no chest pain, headaches, palpitations or shortness of breath. Risk factors for coronary artery disease include diabetes mellitus, dyslipidemia, obesity, sedentary lifestyle, post-menopausal state and family history. Hypertensive end-organ damage includes CVA.     Review of Systems  Constitutional: Positive for fatigue. Negative for chills, fever and unexpected weight change.  HENT: Negative for trouble swallowing and voice change.   Eyes: Negative for visual disturbance.  Respiratory: Negative for cough, shortness of breath and wheezing.   Cardiovascular: Negative for chest pain, palpitations and leg swelling.  Gastrointestinal: Negative for diarrhea, nausea and vomiting.  Endocrine: Positive for polydipsia and polyuria. Negative for cold intolerance, heat intolerance and polyphagia.  Musculoskeletal: Positive for gait problem. Negative for arthralgias  and myalgias.  Skin: Negative for color change, pallor, rash and wound.  Neurological: Negative for seizures and headaches.  Psychiatric/Behavioral: Negative for confusion and suicidal ideas.    Objective:    Vitals with BMI 03/13/2020 03/13/2020 11/15/2019  Height _0  _1  -  Weight 325 lbs 315 lbs -  BMI 63.84 53.64 -  Systolic - 680 321  Diastolic - 74 88  Pulse - 64 64    BP 102/74   Pulse 64   Ht _2  (1.651 m)   Wt (!) 315 lb (142.9 kg)   BMI 52.42 kg/m   Wt Readings from Last 3 Encounters:  03/13/20 (!) 325 lb (147.4 kg)  03/13/20 (!) 315 lb (142.9 kg)  06/17/17 287 lb (130.2 kg)     Physical Exam Constitutional:      Appearance: She is well-developed.  HENT:     Head: Normocephalic and atraumatic.  Eyes:     Conjunctiva/sclera: Conjunctivae normal.     Pupils: Pupils are equal, round, and reactive to light.  Neck:     Thyroid: No thyromegaly.     Trachea: No tracheal deviation.  Cardiovascular:     Rate and Rhythm: Normal rate and regular rhythm.     Heart sounds: No murmur heard.  No friction rub. No gallop.   Pulmonary:     Effort: Pulmonary effort is normal. No respiratory distress.     Breath sounds: No wheezing or rales.  Abdominal:     General: There is no distension.     Palpations: There is no mass.     Tenderness: There is no abdominal tenderness. There is no guarding or rebound.  Musculoskeletal:        General: Normal range of motion.     Cervical back: Normal range of motion and neck supple.  Skin:    General: Skin is warm and dry.     Coloration: Skin is not pale.     Findings: No erythema or rash.  Neurological:     Mental Status: She is alert and oriented to person, place, and time.     Cranial Nerves: No cranial nerve deficit.     Coordination: Coordination normal.     Deep Tendon Reflexes: Reflexes are normal and symmetric.  Psychiatric:        Thought Content: Thought content normal.        Judgment: Judgment normal.        CMP ( most recent) CMP  Component Value Date/Time   NA 141 09/19/2009 1111   K 4.0 09/19/2009 1111   CL 104 09/19/2009 1111   CO2 31 09/19/2009 1111   GLUCOSE 109 (H) 09/19/2009 1111   BUN 13 08/05/2019 0000   CREATININE 0.8 08/05/2019 0000   CREATININE 0.86 09/19/2009 1111   CALCIUM 9.4 09/19/2009 1111   PROT 6.9 04/27/2008 1242   ALBUMIN 3.6 04/27/2008 1242   AST 23 04/27/2008 1242   ALT 17 04/27/2008 1242   ALKPHOS 74 04/27/2008 1242   BILITOT 0.7 04/27/2008 1242   GFRNONAA 85 08/05/2019 0000   GFRAA 99 08/05/2019 0000     Diabetic Labs (most recent): Lab Results  Component Value Date   HGBA1C 12.6 (A) 03/13/2020   HGBA1C 10.9 (A) 11/15/2019   HGBA1C 11.0 08/05/2019      Assessment & Plan:   1. Type 2 diabetes mellitus with hyperglycemia, with long-term current use of insulin ([place])  - TRENACE COUGHLIN has currently uncontrolled symptomatic type 2 DM since  59 years of age. Patient brought in a piece of paper which showed rare, random glucose . Her POC a1c is 12.6% worsening from 10.9%. -she does not report hypoglycemia.   Recent labs reviewed. - I had a long discussion with her about the progressive nature of diabetes and the pathology behind its complications. -her diabetes is complicated by obesity/sedentary life, current diabetes, CVA, CHF  and she remains at a high risk for more acute and chronic complications which include CAD, CVA, CKD, retinopathy, and neuropathy. These are all discussed in detail with her.  - I have counseled her on diet  and weight management  by adopting a carbohydrate restricted/protein rich diet. Patient is encouraged to switch to  unprocessed or minimally processed     complex starch and increased protein intake (animal or plant source), fruits, and vegetables. -  she is advised to stick to a routine mealtimes to eat 3 meals  a day and avoid unnecessary snacks ( to snack only to correct hypoglycemia).   - she  admits there  is a room for improvement in her diet and drink choices. -  Suggestion is made for her to avoid simple carbohydrates  from her diet including Cakes, Sweet Desserts / Pastries, Ice Cream, Soda (diet and regular), Sweet Tea, Candies, Chips, Cookies, Sweet Pastries,  Store Bought Juices, Alcohol in Excess of  1-2 drinks a day, Artificial Sweeteners, Coffee Creamer, and "Sugar-free" Products. This will help patient to have stable blood glucose profile and potentially avoid unintended weight gain.   - she has been  scheduled with Jearld Fenton, RDN, CDE for diabetes education.  - I have approached her with the following individualized plan to manage  her diabetes and patient agrees:   -Given her chronic glycemic burden, she will continue to need intensive treatment with basal/bolus insulin .  -she has her cousin , Jeneen Rinks, who is offering to help her conduct this treatment plan. She is advised to continue Tresiba 60 units nightly, NovoLog 10 units 3 times a day with meals  for pre-meal BG readings of 90-165m/dl, plus patient specific correction dose for unexpected hyperglycemia above 1538mdl, associated with strict monitoring of glucose 4 times a day-before meals and at bedtime. - she is warned not to take insulin without proper monitoring per orders. - Adjustment parameters are given to her for hypo and hyperglycemia in writing. - she is encouraged to call clinic for blood glucose levels less than 70 or above 300 mg /  dl. - she her renal function within normal range, will benefit from low-dose Metformin treatment.  She is advised to continue  Metformin 500 mg p.o. twice daily after breakfast and supper.   - she will be considered for incretin therapy as appropriate next visit.  - Specific targets for  A1c;  LDL, HDL,  and Triglycerides were discussed with the patient.  2) Blood Pressure /Hypertension:  her blood pressure is  Controlled to target.she was supposed to be on lisinopril, as it look like  patient is taking the medication patient advised to bring all her medications for consideration next visit.     3) Lipids/Hyperlipidemia: No recent lipid panel to review.  She was supposed to be pravastatin 40 mg p.o. nightly, does not look like she is taking this medication longer.  She is advised to bring in her medication for reconciliation.    4)  Weight/Diet: She weighs 287 pounds today, clearly complicating her diabetes care.   she is  a candidate for weight loss. I discussed with her the fact that loss of 5 - 10% of her  current body weight will have the most impact on her diabetes management.  Exercise, and detailed carbohydrates information provided  -  detailed on discharge instructions.  5) vitamin D deficiency: She is advised to continue vitamin D2 50,000 units weekly.  6) Chronic Care/Health Maintenance:  -she  Is not on ACEI/ARB and Statin medications and  is encouraged to initiate and continue to follow up with Ophthalmology, Dentist,  Podiatrist at least yearly or according to recommendations, and advised to stay away from smoking. I have recommended yearly flu vaccine and pneumonia vaccine at least every 5 years; moderate intensity exercise for up to 150 minutes weekly; and  sleep for at least 7 hours a day.  - she is  advised to maintain close follow up with Emelda Fear, DO for primary care needs, as well as her other providers for optimal and coordinated care.   - Time spent on this patient care encounter:  35 min, of which > 50% was spent in  counseling and the rest reviewing her blood glucose logs , discussing her hypoglycemia and hyperglycemia episodes, reviewing her current and  previous labs / studies  ( including abstraction from other facilities) and medications  doses and developing a  long term treatment plan and documenting her care.   Please refer to Patient Instructions for Blood Glucose Monitoring and Insulin/Medications Dosing Guide"  in media tab for additional  information. Please  also refer to " Patient Self Inventory" in the Media  tab for reviewed elements of pertinent patient history.  Vinnie Level participated in the discussions, expressed understanding, and voiced agreement with the above plans.  All questions were answered to her satisfaction. she is encouraged to contact clinic should she have any questions or concerns prior to her return visit.   Follow up plan: - Return in about 1 week (around 03/20/2020) for F/U with Meter and Logs Only - no Labs.  Glade Lloyd, MD [facility] [address] [place], [place] [address] Phone: [phone]  Fax: [phone]    03/13/2020, 12:55 PM  This note was partially dictated with voice recognition software. Similar sounding words can be transcribed inadequately or may not  be corrected upon review.

## 2020-03-15 ENCOUNTER — Other Ambulatory Visit: Payer: Self-pay

## 2020-03-15 ENCOUNTER — Telehealth: Payer: Self-pay

## 2020-03-15 MED ORDER — INSULIN PEN NEEDLE 31G X 6 MM MISC: 3 refills | Status: AC

## 2020-03-15 NOTE — Telephone Encounter (Signed)
Patient requesting pen needles for her novolog and tresbia. She uses [id number] - [place], [place] - [address] AT [address] of [address]

## 2020-03-15 NOTE — Telephone Encounter (Signed)
Pen needles called in  °

## 2020-03-20 ENCOUNTER — Ambulatory Visit: Payer: BLUE CROSS/BLUE SHIELD | Admitting: Nutrition

## 2020-03-20 ENCOUNTER — Ambulatory Visit: Payer: BLUE CROSS/BLUE SHIELD | Admitting: "Endocrinology

## 2020-03-21 ENCOUNTER — Encounter: Payer: Self-pay | Admitting: Nutrition

## 2020-03-21 ENCOUNTER — Encounter: Payer: BLUE CROSS/BLUE SHIELD | Admitting: "Endocrinology

## 2020-03-21 ENCOUNTER — Encounter: Payer: Self-pay | Admitting: "Endocrinology

## 2020-03-21 ENCOUNTER — Other Ambulatory Visit: Payer: Self-pay

## 2020-03-21 ENCOUNTER — Encounter: Payer: BLUE CROSS/BLUE SHIELD | Attending: "Endocrinology | Admitting: Nutrition

## 2020-03-21 DIAGNOSIS — Z91199 Patient's noncompliance with other medical treatment and regimen due to unspecified reason: Secondary | ICD-10-CM

## 2020-03-21 DIAGNOSIS — Z794 Long term (current) use of insulin: Secondary | ICD-10-CM | POA: Insufficient documentation

## 2020-03-21 DIAGNOSIS — I10 Essential (primary) hypertension: Secondary | ICD-10-CM

## 2020-03-21 DIAGNOSIS — Z9119 Patient's noncompliance with other medical treatment and regimen: Secondary | ICD-10-CM

## 2020-03-21 DIAGNOSIS — E782 Mixed hyperlipidemia: Secondary | ICD-10-CM

## 2020-03-21 DIAGNOSIS — E1165 Type 2 diabetes mellitus with hyperglycemia: Secondary | ICD-10-CM

## 2020-03-21 NOTE — Patient Instructions (Signed)
Goals  Follow My Plate Bring blood sugar log and meter to all appointments Drink only water  Eat meals on time Use sliding scale as prescribed. Walk in house as much as possible. Do not skip meals. Get BS less than 200's before meals and bedtime. Reschedule appointment with Dr. Fransico Him.

## 2020-03-21 NOTE — Patient Instructions (Signed)
Goals  Follow My Plate Eat three meals per day Take insulin as prescribed. Drink only water Avoid snacks between meals and after dinner. Get BS less than 200's before meals and before bedtime. Do not skip meals or medications. Record BS on sheets and bring with you to all appointments.

## 2020-03-21 NOTE — Progress Notes (Signed)
Medical Nutrition Therapy  Follow up for Type 2 DM. Her newphew brought her but had to stay in car with austic child. Primary concerns today: Type 2 DM. Dx 2 years ago. PCP Dr. Mahoney office. Couldn't see Dr. Nida today because she forgot her meter and BS logs.   She cares for 8 adopted kids; 3 of then autistic. He nephew helps her with them. Has retinopathy. Can't see well due to vision issues. Walks with a cane. Difficulity walking due to swelling, obesity and arthritis and other medical issues. Has edema in her feet today. Admits to neuropathy in hands and feet. She has been working on eating better and drinking more water. FBS per her memory: DIdn't bring log sheets or meter: : range 288-388 mg/dl. BL) 388 mg/dl yesterday. BD) 332 mg/dl. Bedtime: 213 mg/dl. She notes BS was down in 190's once and she didn't feel well.  Referral diagnosis: E11. 65, Z79.4 Preferred learning style:  , no preference indicated learning readiness:  Ready   Changes made: eating meals on time. Is using the sectional plates. Her nephew is fixing meals and giving her her medications and insulin as prescribed. She has a tremor in her hand and unable to give herself insulin.  NUTRITION ASSESSMENT   Anthropometrics  Vitals with BMI 03/13/2020  Height 5' 5"  Weight 315 lbs  BMI 52.42  Systolic 102  Diastolic 74  Pulse 64    Clinical Medical Hx: HTN, morbid obesity, Hyperlipidemia, CHF Medications: Tresiba 60, Novolog 10 units with meals plus sliding scale Metformin 500 mg BID. Labs:  Lab Results  Component Value Date   HGBA1C 12.6 (A) 03/13/2020   CMP Latest Ref Rng & Units 08/05/2019 09/19/2009 04/27/2008  Glucose 70 - 99 mg/dL - 109(H) 94  BUN 4 - 21 13 9 10  Creatinine 0.5 - 1.1 0.8 0.86 0.63  Sodium 135 - 145 mEq/L - 141 137  Potassium 3.5 - 5.1 mEq/L - 4.0 3.8  Chloride 96 - 112 mEq/L - 104 103  CO2 19 - 32 mEq/L - 31 28  Calcium 8.4 - 10.5 mg/dL - 9.4 8.9  Total Protein 6.0 - 8.3 g/dL - - 6.9   Total Bilirubin 0.3 - 1.2 mg/dL - - 0.7  Alkaline Phos 39 - 117 units/L - - 74  AST 0 - 37 units/L - - 23  ALT 0 - 35 units/L - - 17    24 hr recall:   B)2 boiled eggs, yogurt and fruit, L) Turkey leftovers-same as dinner, water D)Tur[place], green beans, 2 boiled potatoes, asparagus, water,   NUTRITION DIAGNOSIS  Uncontrolled Diabetes Type 2 and Morbid obesity related to excessive calorie intake  as evidenced by BMI > 40 and A1C 12.6%   NUTRITION INTERVENTION  Nutrition education (E-1) on the following topics:  . Diabetes, meal planning . Sliding scale for Novolog insulin with meals. . Treatment of Hypo/hyperglycemia.   Handouts Provided Include   The Plate Method   Diabetes Instructions.   Learning Style & Readiness for Change Teaching method utilized: Visual & Auditory  Demonstrated degree of understanding via: Teach Back  Barriers to learning/adherence to lifestyle change: morbid obesity  Goals Established by Pt Goals  Follow My Plate Bring blood sugar log and meter to all appointments Drink only water  Eat meals on time Use sliding scale as prescribed. Walk in house as much as possible. Do not skip meals. Get BS less than 200's before meals and bedtime. Reschedule appointment with Dr. Nida.     MONITORING & EVALUATION Dietary intake, weekly physical activity, and follow up 1 week.  Next Steps  Patient is to Check blood sugars 4 times a day, use sliding scale and bring back in 2 week to see me and Dr. Nida.  Record blood sugars on sheets.. 

## 2020-03-21 NOTE — Progress Notes (Signed)
No meter or logs. BG 333 this a.m. Every reading has been in th 300's since last visit.

## 2020-03-22 NOTE — Progress Notes (Signed)
This encounter was created in error - please disregard.

## 2020-04-02 ENCOUNTER — Emergency Department (HOSPITAL_COMMUNITY)
Admission: EM | Admit: 2020-04-02 | Discharge: 2020-04-02 | Disposition: A | Discharge status: Home / Self Care | Payer: BLUE CROSS/BLUE SHIELD | Attending: Emergency Medicine | Admitting: Emergency Medicine

## 2020-04-02 ENCOUNTER — Encounter (HOSPITAL_COMMUNITY): Payer: Self-pay

## 2020-04-02 ENCOUNTER — Emergency Department (HOSPITAL_COMMUNITY): Payer: BLUE CROSS/BLUE SHIELD

## 2020-04-02 ENCOUNTER — Other Ambulatory Visit: Payer: Self-pay

## 2020-04-02 DIAGNOSIS — S060X9A Concussion with loss of consciousness of unspecified duration, initial encounter: Secondary | ICD-10-CM | POA: Insufficient documentation

## 2020-04-02 DIAGNOSIS — Z7901 Long term (current) use of anticoagulants: Secondary | ICD-10-CM | POA: Insufficient documentation

## 2020-04-02 DIAGNOSIS — E1165 Type 2 diabetes mellitus with hyperglycemia: Secondary | ICD-10-CM | POA: Diagnosis not present

## 2020-04-02 DIAGNOSIS — Z794 Long term (current) use of insulin: Secondary | ICD-10-CM | POA: Insufficient documentation

## 2020-04-02 DIAGNOSIS — Z8673 Personal history of transient ischemic attack (TIA), and cerebral infarction without residual deficits: Secondary | ICD-10-CM | POA: Diagnosis not present

## 2020-04-02 DIAGNOSIS — Z7984 Long term (current) use of oral hypoglycemic drugs: Secondary | ICD-10-CM | POA: Insufficient documentation

## 2020-04-02 DIAGNOSIS — Z79899 Other long term (current) drug therapy: Secondary | ICD-10-CM | POA: Insufficient documentation

## 2020-04-02 DIAGNOSIS — S99912A Unspecified injury of left ankle, initial encounter: Secondary | ICD-10-CM | POA: Insufficient documentation

## 2020-04-02 DIAGNOSIS — I509 Heart failure, unspecified: Secondary | ICD-10-CM | POA: Insufficient documentation

## 2020-04-02 DIAGNOSIS — J45901 Unspecified asthma with (acute) exacerbation: Secondary | ICD-10-CM | POA: Insufficient documentation

## 2020-04-02 DIAGNOSIS — I110 Hypertensive heart disease with heart failure: Secondary | ICD-10-CM | POA: Insufficient documentation

## 2020-04-02 DIAGNOSIS — S5001XA Contusion of right elbow, initial encounter: Secondary | ICD-10-CM | POA: Diagnosis not present

## 2020-04-02 DIAGNOSIS — W010XXA Fall on same level from slipping, tripping and stumbling without subsequent striking against object, initial encounter: Secondary | ICD-10-CM | POA: Diagnosis not present

## 2020-04-02 DIAGNOSIS — S99919A Unspecified injury of unspecified ankle, initial encounter: Secondary | ICD-10-CM

## 2020-04-02 DIAGNOSIS — S0990XA Unspecified injury of head, initial encounter: Secondary | ICD-10-CM | POA: Diagnosis present

## 2020-04-02 DIAGNOSIS — S99911A Unspecified injury of right ankle, initial encounter: Secondary | ICD-10-CM | POA: Insufficient documentation

## 2020-04-02 MED ORDER — NAPROXEN 500 MG PO TABS: 500.0000 mg | ORAL_TABLET | Freq: Two times a day (BID) | ORAL | 0 refills | Status: AC

## 2020-04-02 MED ORDER — NAPROXEN 250 MG PO TABS
500.0000 mg | ORAL_TABLET | Freq: Once | ORAL | Status: AC
  Administered 2020-04-02: 23:00:00 500 mg via ORAL
  Filled 2020-04-02: qty 2

## 2020-04-02 MED ORDER — METHOCARBAMOL 500 MG PO TABS: 500.0000 mg | ORAL_TABLET | Freq: Two times a day (BID) | ORAL | 0 refills | Status: AC | PRN

## 2020-04-02 NOTE — Discharge Instructions (Signed)
Your CT has been normal - no bleeding. Your bones are normal nothing broken There is arthritis  Robaxin twice daily as needed for muscle relaxer  Please take Naprosyn, 500mg  by mouth twice daily as needed for pain - this in an antiinflammatory medicine (NSAID) and is similar to ibuprofen - many people feel that it is stronger than ibuprofen and it is easier to take since it is a smaller pill.  Please use this only for 1 week - if your pain persists, you will need to follow up with your doctor in the office for ongoing guidance and pain control.

## 2020-04-02 NOTE — ED Provider Notes (Signed)
[facility] EMERGENCY DEPARTMENT Provider Note   CSN: [id number] Arrival date & time: 04/02/20  1940     History Chief Complaint  Patient presents with  . Fall    Katherine Barry is a 59 y.o. female.  HPI   The patient was in her usual state of health when she had an accidental fall getting out of the shower, she slipped, she fell and flipped out of the tub striking her head on the sink and landing on the ground awkwardly twisting her bilateral ankles.  She had difficulty ambulating, she arrived by private vehicle, she has tenderness to the bilateral ankles and feet, she states that she is unsure if she lost consciousness.  She feels a little bit dizzy but not nauseated at this time.  This occurred just prior to arrival, symptoms are constant, worse with range of motion or trying to ambulate.  No associated loss of vision, she does not feel numb or weak, she just has pain  Past Medical History:  Diagnosis Date  . DDD (degenerative disc disease), cervical   . Diabetes mellitus without complication ([place])   . Hypertension   . Pulmonary embolism ([place])   . Stroke [facility])     Patient Active Problem List   Diagnosis Date Noted  . Personal history of noncompliance with medical treatment, presenting hazards to health 03/13/2020  . Type 2 diabetes mellitus with hyperglycemia, with long-term current use of insulin ([place]) 11/15/2019  . PAIN IN JOINT, MULTIPLE SITES 09/06/2009  . ANSERINE BURSITIS, LEFT 07/06/2009  . BURSITIS, RIGHT SHOULDER 06/12/2009  . IMPINGEMENT SYNDROME 06/12/2009  . POSTMENOPAUSAL OSTEOPOROSIS 05/04/2008  . EDEMA 05/04/2008  . ASTHMA, WITH ACUTE EXACERBATION 04/27/2008  . INSOMNIA 03/28/2008  . Morbid obesity ([place]) 03/01/2008  . OTHER SPECIFIED DISORDERS OF THYROID 02/29/2008  . Mixed hyperlipidemia 02/29/2008  . ANXIETY 02/29/2008  . Essential hypertension, benign 02/29/2008  . ABNORMAL HEART RHYTHMS 02/29/2008  . CONGESTIVE HEART FAILURE 02/29/2008  .  ALLERGIC RHINITIS 02/29/2008  . ASTHMA 02/29/2008  . GERD 02/29/2008  . UNSPECIFIED CONSTIPATION 02/29/2008  . OVERACTIVE BLADDER 02/29/2008  . MENOPAUSAL SYNDROME 02/29/2008  . OSTEOARTHRITIS 02/29/2008  . FIBROMYALGIA 02/29/2008  . MIGRAINES, HX OF 02/29/2008  . LOW BACK PAIN 09/08/2007    Past Surgical History:  Procedure Laterality Date  . CHOLECYSTECTOMY    . HAND SURGERY    . HYSTEROSCOPY       OB History   No obstetric history on file.     Family History  Problem Relation Age of Onset  . Diabetes Mother   . Hypertension Mother   . Diabetes Father   . Hypertension Father   . Heart attack Father   . Stroke Father   . Kidney disease Father     Social History   Tobacco Use  . Smoking status: Never Smoker  . Smokeless tobacco: Never Used  Vaping Use  . Vaping Use: Never used  Substance Use Topics  . Alcohol use: Never  . Drug use: No    Home Medications Prior to Admission medications   Medication Sig Start Date End Date Taking? Authorizing Provider  albuterol (VENTOLIN HFA) 108 (90 Base) MCG/ACT inhaler Inhale 2 puffs into the lungs 2 (two) times daily. 07/26/04   [provider]  Blood Glucose Monitoring Suppl (ACCU-CHEK GUIDE ME) w/Device KIT 1 Piece by Does not apply route as directed. 03/13/20   Cassandria Anger, MD  bumetanide (BUMEX) 2 MG tablet Take 2 mg by mouth 2 (  two) times daily. 10/11/19   [provider]  ELIQUIS 5 MG TABS tablet Take 5 mg by mouth 2 (two) times daily. 08/21/19   [provider]  furosemide (LASIX) 40 MG tablet Take 40 mg by mouth daily as needed. 10/11/19   [provider]  gabapentin (NEURONTIN) 800 MG tablet Take 800 mg by mouth 3 (three) times daily. 10/30/19   [provider]  glucose blood (ACCU-CHEK GUIDE) test strip Use as instructed 03/13/20   Cassandria Anger, MD  insulin aspart (NOVOLOG) 100 UNIT/ML FlexPen Inject 10-16 Units into the skin 3 (three) times daily with  meals. 11/15/19   Cassandria Anger, MD  insulin degludec (TRESIBA FLEXTOUCH) 200 UNIT/ML FlexTouch Pen Inject 60 Units into the skin at bedtime. 11/15/19   Cassandria Anger, MD  Insulin Pen Needle 31G X 6 MM MISC Use to inject insulin four times daily dx e11.65 03/15/20   Cassandria Anger, MD  meclizine (ANTIVERT) 25 MG tablet Take 25 mg by mouth 3 (three) times daily as needed. 08/27/19   [provider]  metFORMIN (GLUCOPHAGE) 500 MG tablet TAKE 1 TABLET (500 MG TOTAL) BY MOUTH 2 (TWO) TIMES DAILY WITH A MEAL. Patient not taking: Reported on 03/13/2020 02/09/20   Cassandria Anger, MD  methocarbamol (ROBAXIN) 500 MG tablet Take 1 tablet (500 mg total) by mouth 2 (two) times daily as needed for muscle spasms. 04/02/20   Noemi Chapel, MD  metoprolol tartrate (LOPRESSOR) 100 MG tablet Take 100 mg by mouth 2 (two) times daily. 01/11/20   [provider]  naproxen (NAPROSYN) 500 MG tablet Take 1 tablet (500 mg total) by mouth 2 (two) times daily. 04/02/20   Noemi Chapel, MD  pantoprazole (PROTONIX) 40 MG tablet Take 40 mg by mouth daily.    [provider]  potassium chloride SA (KLOR-CON) 20 MEQ tablet Take 20 mEq by mouth daily.    [provider]  Vitamin D, Ergocalciferol, (DRISDOL) 1.25 MG (50000 UNIT) CAPS capsule Take 50,000 Units by mouth once a week. 11/09/19   [provider]    Allergies    Baclofen  Review of Systems   Review of Systems  All other systems reviewed and are negative.   Physical Exam Updated Vital Signs BP 132/79 (BP Location: Right Arm)   Pulse 77   Temp 98.5 F (36.9 C) (Oral)   Resp 17   Ht 1.651 m ('5\' 5"' )   Wt (!) 140.6 kg   SpO2 96%   BMI 51.59 kg/m   Physical Exam Vitals and nursing note reviewed.  Constitutional:      General: She is not in acute distress.    Appearance: She is well-developed and well-nourished.  HENT:     Head: Normocephalic and atraumatic.     Comments: No signs of  trauma to the head - there is no depression, no bleeding, no hematoma but is ttp over the posterior occiput.    Mouth/Throat:     Mouth: Oropharynx is clear and moist.     Pharynx: No oropharyngeal exudate.  Eyes:     General: No scleral icterus.       Right eye: No discharge.        Left eye: No discharge.     Extraocular Movements: EOM normal.     Conjunctiva/sclera: Conjunctivae normal.     Pupils: Pupils are equal, round, and reactive to light.  Neck:     Thyroid: No thyromegaly.  Vascular: No JVD.  Cardiovascular:     Rate and Rhythm: Normal rate and regular rhythm.     Pulses: Intact distal pulses.     Heart sounds: Normal heart sounds. No murmur heard. No friction rub. No gallop.   Pulmonary:     Effort: Pulmonary effort is normal. No respiratory distress.     Breath sounds: Normal breath sounds. No wheezing or rales.  Abdominal:     General: Bowel sounds are normal. There is no distension.     Palpations: Abdomen is soft. There is no mass.     Tenderness: There is no abdominal tenderness.  Musculoskeletal:        General: Tenderness present. Normal range of motion.     Cervical back: Normal range of motion and neck supple.     Right lower leg: Edema present.     Left lower leg: Edema present.     Comments: No obvious deformities of the arms or the legs.  The patient does have tenderness over the right elbow with range of motion but has normal appearance and a very supple joint, left upper extremity is totally normal, the right upper extremity is only abnormal at the elbow but normal at the shoulder and the wrist and the hand.  She is able to straight leg raise bilaterally and bend both hips however she does have some tenderness to range of motion of the bilateral ankles and feet.  She has bilateral lower extremity edema  Lymphadenopathy:     Cervical: No cervical adenopathy.  Skin:    General: Skin is warm and dry.     Findings: No erythema or rash.  Neurological:      Mental Status: She is alert.     Coordination: Coordination normal.     Comments: The patient is awake alert and following commands, normal mental status, states that she feels dizzy but has normal strength in all 4 extremities normal memory, normal cranial nerves III through  Psychiatric:        Mood and Affect: Mood and affect normal.        Behavior: Behavior normal.     ED Results / Procedures / Treatments   Labs (all labs ordered are listed, but only abnormal results are displayed) Labs Reviewed - No data to display  EKG EKG Interpretation  Date/Time:  Sunday April 02 2020 19:57:18 EST Ventricular Rate:  94 PR Interval:  130 QRS Duration: 80 QT Interval:  390 QTC Calculation: 487 R Axis:   -12 Text Interpretation: Normal sinus rhythm Nonspecific T wave abnormality Abnormal ECG Since last tracing rate faster t wave abnormliaties seen on prior Confirmed by Noemi Chapel [phone]) on 04/02/2020 8:06:46 PM   Radiology DG Pelvis 1-2 Views  Result Date: 04/02/2020 CLINICAL DATA:  Fall getting out of the shower this evening. Pain. EXAM: PELVIS - 1-2 VIEW COMPARISON:  None. FINDINGS: The cortical margins of the bony pelvis are intact. No fracture. Pubic symphysis and sacroiliac joints are congruent. Bilateral sacroiliac joint degenerative change. Bilateral hip joint space narrowing and acetabular spurring. IMPRESSION: 1. No acute pelvic fracture. 2. Bilateral hip and sacroiliac joint degenerative change. Electronically Signed   By: Keith Rake M.D.   On: 04/02/2020 21:40   DG ELBOW COMPLETE RIGHT (3+VIEW)  Result Date: 04/02/2020 CLINICAL DATA:  Fall, right arm pain EXAM: RIGHT ELBOW - COMPLETE 3+ VIEW COMPARISON:  None. FINDINGS: No acute bony abnormality. Specifically, no fracture, subluxation, or dislocation. No joint effusion. Joint space maintained. IMPRESSION:  No acute bony abnormality. Electronically Signed   By: Rolm Baptise M.D.   On: 04/02/2020 21:30   DG Ankle  Complete Left  Result Date: 04/02/2020 CLINICAL DATA:  Fall getting out of the shower this evening. Left ankle pain. EXAM: LEFT ANKLE COMPLETE - 3+ VIEW COMPARISON:  None. FINDINGS: There is no evidence of fracture, dislocation, or joint effusion. Ankle mortise is preserved. Small plantar calcaneal spur and Achilles tendon enthesophyte. Minimal midfoot degenerative change. There is generalized diffuse soft tissue edema. IMPRESSION: 1. No acute fracture or dislocation. 2. Generalized soft tissue edema of unknown chronicity. Electronically Signed   By: Keith Rake M.D.   On: 04/02/2020 21:32   DG Ankle Complete Right  Result Date: 04/02/2020 CLINICAL DATA:  Fall getting out of the shower this evening. Right ankle pain. EXAM: RIGHT ANKLE - COMPLETE 3+ VIEW COMPARISON:  None. FINDINGS: There is no evidence of fracture, dislocation, or joint effusion. Ankle mortise is preserved. Plantar calcaneal spur and Achilles tendon enthesophyte. Generalized soft tissue edema. IMPRESSION: 1. No fracture or dislocation of the right ankle. 2. Soft tissue edema of unknown chronicity. Electronically Signed   By: Keith Rake M.D.   On: 04/02/2020 21:31   CT Head Wo Contrast  Result Date: 04/02/2020 CLINICAL DATA:  Fall, head injury, loss of consciousness EXAM: CT HEAD WITHOUT CONTRAST TECHNIQUE: Contiguous axial images were obtained from the base of the skull through the vertex without intravenous contrast. COMPARISON:  None. FINDINGS: Brain: Normal anatomic configuration. No abnormal intra or extra-axial mass lesion or fluid collection. No abnormal mass effect or midline shift. No evidence of acute intracranial hemorrhage or infarct. Ventricular size is normal. Cerebellum unremarkable. Vascular: Unremarkable Skull: Intact Sinuses/Orbits: Paranasal sinuses are clear. Orbits are unremarkable. Other: Mastoid air cells and middle ear cavities are clear. IMPRESSION: No acute intracranial abnormality.  No calvarial  fracture. Electronically Signed   By: Fidela Salisbury MD   On: 04/02/2020 21:35   DG Foot Complete Left  Result Date: 04/02/2020 CLINICAL DATA:  Fall getting out of the shower this evening. Left foot pain. EXAM: LEFT FOOT - COMPLETE 3+ VIEW COMPARISON:  None. FINDINGS: There is no evidence of fracture or dislocation. Mild midfoot degenerative change with spurring. Generalized soft tissue edema most prominent over the dorsum of the foot. IMPRESSION: Soft tissue edema without acute fracture or subluxation. Electronically Signed   By: Keith Rake M.D.   On: 04/02/2020 21:38   DG Foot Complete Right  Result Date: 04/02/2020 CLINICAL DATA:  Fall getting out of the shower this evening. Right foot pain. EXAM: RIGHT FOOT COMPLETE - 3+ VIEW COMPARISON:  None. FINDINGS: There is no evidence of fracture or dislocation. Mild midfoot degenerative change with spurring. Generalized soft tissue edema most prominent over the dorsum of the foot. IMPRESSION: Soft tissue edema without acute fracture or subluxation. Electronically Signed   By: Keith Rake M.D.   On: 04/02/2020 21:37    Procedures Procedures (including critical care time)  Medications Ordered in ED Medications  naproxen (NAPROSYN) tablet 500 mg (500 mg Oral Given 04/02/20 2253)    ED Course  I have reviewed the triage vital signs and the nursing notes.  Pertinent labs & imaging results that were available during my care of the patient were reviewed by me and considered in my medical decision making (see chart for details).    MDM Rules/Calculators/A&P  Due to the injury to the head as well as multiple extremity complaints she will need some imaging to rule out intracranial hemorrhage or fracture however this seems like it might just be a mild concussion With associated musculoskeletal minor trauma.  Thankfully the patient has no signs of fractures of the skull or brain hemorrhage, there is no signs of  injury to the ankles or the feet or the elbow.  She does have some arthritis.  Her EKG is unremarkable, she has likely had a light concussion as she has some ongoing headache and dizziness.  Family member at the bedside, all the results were shared with the patient and the family member with her permission.  This patient is stable for discharge, will be discharged home on an anti-inflammatory and a muscle relaxer,  They have voiced their understanding of the indications for return  Final Clinical Impression(s) / ED Diagnoses Final diagnoses:  Concussion with loss of consciousness, initial encounter  Contusion of right elbow, initial encounter  Ankle injury, initial encounter    Rx / DC Orders ED Discharge Orders         Ordered    naproxen (NAPROSYN) 500 MG tablet  2 times daily        04/02/20 2255    methocarbamol (ROBAXIN) 500 MG tablet  2 times daily PRN        04/02/20 2255           Noemi Chapel, MD 04/02/20 2258

## 2020-04-02 NOTE — ED Triage Notes (Signed)
Pt arrives from home via POV c/o right arm pain, bilateral leg pain and posterior head pain following a fall which occurred while Pt was stepping out of the shower this evening. Pt reports brief period of LOC and confusion with nausea following the fall.

## 2020-04-02 NOTE — ED Notes (Signed)
PT educated with DC instructions and verbalized complete understanding, denies questions at this time.

## 2020-04-06 ENCOUNTER — Ambulatory Visit (INDEPENDENT_AMBULATORY_CARE_PROVIDER_SITE_OTHER): Payer: BLUE CROSS/BLUE SHIELD | Admitting: "Endocrinology

## 2020-04-06 ENCOUNTER — Encounter: Payer: BLUE CROSS/BLUE SHIELD | Attending: Nurse Practitioner | Admitting: Nutrition

## 2020-04-06 ENCOUNTER — Encounter: Payer: Self-pay | Admitting: "Endocrinology

## 2020-04-06 ENCOUNTER — Encounter: Payer: Self-pay | Admitting: Nutrition

## 2020-04-06 ENCOUNTER — Ambulatory Visit: Payer: BLUE CROSS/BLUE SHIELD | Admitting: Nutrition

## 2020-04-06 ENCOUNTER — Other Ambulatory Visit: Payer: Self-pay

## 2020-04-06 VITALS — BP 158/111 | HR 60 | Ht 65.0 in | Wt 289.5 lb

## 2020-04-06 DIAGNOSIS — E782 Mixed hyperlipidemia: Secondary | ICD-10-CM | POA: Insufficient documentation

## 2020-04-06 DIAGNOSIS — Z794 Long term (current) use of insulin: Secondary | ICD-10-CM | POA: Diagnosis not present

## 2020-04-06 DIAGNOSIS — E1165 Type 2 diabetes mellitus with hyperglycemia: Secondary | ICD-10-CM

## 2020-04-06 DIAGNOSIS — I10 Essential (primary) hypertension: Secondary | ICD-10-CM | POA: Insufficient documentation

## 2020-04-06 DIAGNOSIS — Z9119 Patient's noncompliance with other medical treatment and regimen: Secondary | ICD-10-CM | POA: Insufficient documentation

## 2020-04-06 DIAGNOSIS — Z91199 Patient's noncompliance with other medical treatment and regimen due to unspecified reason: Secondary | ICD-10-CM

## 2020-04-06 MED ORDER — DEXCOM G6 TRANSMITTER MISC: 1.0000 | 1 refills | Status: AC

## 2020-04-06 MED ORDER — DEXCOM G6 SENSOR MISC: 4.0000 | 2 refills | Status: AC

## 2020-04-06 MED ORDER — DEXCOM G6 RECEIVER DEVI: 1.0000 | 0 refills | Status: AC | PRN

## 2020-04-06 MED ORDER — TRESIBA FLEXTOUCH 200 UNIT/ML ~~LOC~~ SOPN: 50.0000 [IU] | PEN_INJECTOR | Freq: Every day | SUBCUTANEOUS | 2 refills | Status: AC

## 2020-04-06 NOTE — Progress Notes (Unsigned)
Medical Nutrition Therapy  Follow up for Type 2 DM. Her newphew brought her but had to stay in car with austic child. Primary concerns today: Type 2 DM. Dx 2 years ago. PCP Dr. Leary Roca office. Couldn't see Dr. Fransico Him today because she forgot her meter and BS logs.   She cares for 8 adopted kids; 3 of then autistic. He nephew helps her with them. Has retinopathy. Can't see well due to vision issues. Walks with a cane. Difficulity walking due to swelling, obesity and arthritis and other medical issues. Has edema in her feet today. Admits to neuropathy in hands and feet. She has been working on eating better and drinking more water. FBS per her memory: DIdn't bring log sheets or meter: : range 288-388 mg/dl. BL) 388 mg/dl yesterday. BD) 332 mg/dl. Bedtime: 213 mg/dl. She notes BS was down in 190's once and she didn't feel well.  Referral diagnosis: E11. 65, Z79.4 Preferred learning style:  , no preference indicated learning readiness:  Ready   Changes made: eating meals on time. Is using the sectional plates. Her nephew is fixing meals and giving her her medications and insulin as prescribed. She has a tremor in her hand and unable to give herself insulin.  NUTRITION ASSESSMENT   Anthropometrics  Vitals with BMI 03/13/2020  Height 5\' 5"   Weight 315 lbs  BMI 52.42  Systolic 102  Diastolic 74  Pulse 64    Clinical Medical Hx: HTN, morbid obesity, Hyperlipidemia, CHF Medications: Tresiba 60, Novolog 10 units with meals plus sliding scale Metformin 500 mg BID. Labs:  Lab Results  Component Value Date   HGBA1C 12.6 (A) 03/13/2020   CMP Latest Ref Rng & Units 08/05/2019 09/19/2009 04/27/2008  Glucose 70 - 99 mg/dL - 06/25/2008) 94  BUN 4 - 21 13 9 10   Creatinine 0.5 - 1.1 0.8 0.86 0.63  Sodium 135 - 145 mEq/L - 141 137  Potassium 3.5 - 5.1 mEq/L - 4.0 3.8  Chloride 96 - 112 mEq/L - 104 103  CO2 19 - 32 mEq/L - 31 28  Calcium 8.4 - 10.5 mg/dL - 9.4 8.9  Total Protein 6.0 - 8.3 g/dL - - 6.9   Total Bilirubin 0.3 - 1.2 mg/dL - - 0.7  Alkaline Phos 39 - 117 units/L - - 74  AST 0 - 37 units/L - - 23  ALT 0 - 35 units/L - - 17    24 hr recall:   B)2 boiled eggs, yogurt and fruit, L) 099(I leftovers-same as dinner, water D)Turkey, green beans, 2 boiled potatoes, asparagus, water,   NUTRITION DIAGNOSIS  Uncontrolled Diabetes Type 2 and Morbid obesity related to excessive calorie intake  as evidenced by BMI > 40 and A1C 12.6%   NUTRITION INTERVENTION  Nutrition education (E-1) on the following topics:  . Diabetes, meal planning . Sliding scale for Novolog insulin with meals. . Treatment of Hypo/hyperglycemia.   Handouts Provided Include   The Plate Method   Diabetes Instructions.   Learning Style & Readiness for Change Teaching method utilized: Visual & Auditory  Demonstrated degree of understanding via: Teach Back  Barriers to learning/adherence to lifestyle change: morbid obesity  Goals Established by Pt Goals  Follow My Plate Bring blood sugar log and meter to all appointments Drink only water  Eat meals on time Use sliding scale as prescribed. Walk in house as much as possible. Do not skip meals. Get BS less than 200's before meals and bedtime. Reschedule appointment with Dr. .  MONITORING & EVALUATION Dietary intake, weekly physical activity, and follow up 1 week.  Next Steps  Patient is to Check blood sugars 4 times a day, use sliding scale and bring back in 2 week to see me and Dr. Fransico Him.  Record blood sugars on sheets.Marland Kitchen

## 2020-04-06 NOTE — Progress Notes (Signed)
04/06/2020, 12:58 PM   Endocrinology follow-up note  Subjective:    Patient ID: Katherine Barry, female    DOB: 1960-11-03.  Katherine Barry is being seen in follow up after she was seen in  consultation for management of currently uncontrolled symptomatic diabetes requested by  Emelda Fear, DO.   Past Medical History:  Diagnosis Date  . DDD (degenerative disc disease), cervical   . Diabetes mellitus without complication ([place])   . Hypertension   . Pulmonary embolism ([place])   . Stroke [facility])     Past Surgical History:  Procedure Laterality Date  . CHOLECYSTECTOMY    . HAND SURGERY    . HYSTEROSCOPY      Social History   Socioeconomic History  . Marital status: Married    Spouse name: Not on file  . Number of children: Not on file  . Years of education: Not on file  . Highest education level: Not on file  Occupational History  . Not on file  Tobacco Use  . Smoking status: Never Smoker  . Smokeless tobacco: Never Used  Vaping Use  . Vaping Use: Never used  Substance and Sexual Activity  . Alcohol use: Never  . Drug use: No  . Sexual activity: Not Currently  Other Topics Concern  . Not on file  Social History Narrative  . Not on file   Social Determinants of Health   Financial Resource Strain: Not on file  Food Insecurity: Not on file  Transportation Needs: Not on file  Physical Activity: Not on file  Stress: Not on file  Social Connections: Not on file    Family History  Problem Relation Age of Onset  . Diabetes Mother   . Hypertension Mother   . Diabetes Father   . Hypertension Father   . Heart attack Father   . Stroke Father   . Kidney disease Father     Outpatient Encounter Medications as of 04/06/2020  Medication Sig  . albuterol (VENTOLIN HFA) 108 (90 Base) MCG/ACT inhaler Inhale 2 puffs into the lungs 2 (two) times daily.  . Blood Glucose Monitoring Suppl  (ACCU-CHEK GUIDE ME) w/Device KIT 1 Piece by Does not apply route as directed.  . bumetanide (BUMEX) 2 MG tablet Take 2 mg by mouth 2 (two) times daily.  . Continuous Blood Gluc Receiver (DEXCOM G6 RECEIVER) DEVI 1 Piece by Does not apply route as needed.  . Continuous Blood Gluc Sensor (DEXCOM G6 SENSOR) MISC 4 Pieces by Does not apply route once a week.  . Continuous Blood Gluc Transmit (DEXCOM G6 TRANSMITTER) MISC 1 Piece by Does not apply route as directed.  Marland Kitchen ELIQUIS 5 MG TABS tablet Take 5 mg by mouth 2 (two) times daily.  . furosemide (LASIX) 40 MG tablet Take 40 mg by mouth daily as needed.  . gabapentin (NEURONTIN) 800 MG tablet Take 800 mg by mouth 3 (three) times daily.  Marland Kitchen glucose blood (ACCU-CHEK GUIDE) test strip Use as instructed  . insulin aspart (NOVOLOG) 100 UNIT/ML FlexPen Inject 10-16 Units into the skin 3 (three) times daily with meals.  . insulin  degludec (TRESIBA FLEXTOUCH) 200 UNIT/ML FlexTouch Pen Inject 50 Units into the skin at bedtime.  . Insulin Pen Needle 31G X 6 MM MISC Use to inject insulin four times daily dx e11.65  . meclizine (ANTIVERT) 25 MG tablet Take 25 mg by mouth 3 (three) times daily as needed.  . methocarbamol (ROBAXIN) 500 MG tablet Take 1 tablet (500 mg total) by mouth 2 (two) times daily as needed for muscle spasms.  . metoprolol tartrate (LOPRESSOR) 100 MG tablet Take 100 mg by mouth 2 (two) times daily.  . naproxen (NAPROSYN) 500 MG tablet Take 1 tablet (500 mg total) by mouth 2 (two) times daily.  . pantoprazole (PROTONIX) 40 MG tablet Take 40 mg by mouth daily.  . potassium chloride SA (KLOR-CON) 20 MEQ tablet Take 20 mEq by mouth daily.  . Vitamin D, Ergocalciferol, (DRISDOL) 1.25 MG (50000 UNIT) CAPS capsule Take 50,000 Units by mouth once a week.  . [DISCONTINUED] insulin degludec (TRESIBA FLEXTOUCH) 200 UNIT/ML FlexTouch Pen Inject 60 Units into the skin at bedtime.  . [DISCONTINUED] metFORMIN (GLUCOPHAGE) 500 MG tablet TAKE 1 TABLET (500 MG  TOTAL) BY MOUTH 2 (TWO) TIMES DAILY WITH A MEAL. (Patient not taking: Reported on 03/13/2020)   No facility-administered encounter medications on file as of 04/06/2020.    ALLERGIES: Allergies  Allergen Reactions  . Baclofen     VACCINATION STATUS: Immunization History  Administered Date(s) Administered  . Influenza Whole 02/08/2008  . Pneumococcal Polysaccharide-23 03/28/2008    Diabetes She presents for her follow-up diabetic visit. She has type 2 diabetes mellitus. Onset time: Patient was diagnosed at approximate age of 20 years. Her disease course has been worsening. There are no hypoglycemic associated symptoms. Pertinent negatives for hypoglycemia include no confusion, headaches, pallor or seizures. Associated symptoms include blurred vision, fatigue, polydipsia and polyuria. Pertinent negatives for diabetes include no chest pain and no polyphagia. There are no hypoglycemic complications. Symptoms are worsening. Diabetic complications include a CVA, heart disease and peripheral neuropathy. Risk factors for coronary artery disease include diabetes mellitus, dyslipidemia, family history, obesity, hypertension, post-menopausal and sedentary lifestyle. Current diabetic treatment includes insulin injections (She is currently on regular insulin 5 units 3 times daily, NPH 60 units twice daily.). Her weight is increasing steadily. She is following a generally unhealthy diet. When asked about meal planning, she reported none. She has not had a previous visit with a dietitian. She never (Patient is wheelchair-bound due to previous CVA, deconditioning, diffuse arthritis.) participates in exercise. She monitors blood glucose at home 5+ x per day. Her home blood glucose trend is decreasing steadily. Her breakfast blood glucose range is generally >200 mg/dl. Her lunch blood glucose range is generally >200 mg/dl. Her dinner blood glucose range is generally >200 mg/dl. Her bedtime blood glucose range is  generally >200 mg/dl. Her overall blood glucose range is >200 mg/dl. (She presents with better monitoring attempt, still significantly above target. She reports that she feels bad when her blood glucose readings are below 300 mg per DL. And she actively avoids her blood glucose from dropping to below 200 mg per DL . -No documented or reported hypoglycemia. Review of her glycemic profile shows lowest blood glucose readings of 134.) An ACE inhibitor/angiotensin II receptor blocker is being taken. Eye exam is not current.  Hyperlipidemia This is a chronic problem. The current episode started more than 1 year ago. Exacerbating diseases include diabetes and obesity. Pertinent negatives include no chest pain, myalgias or shortness of breath. Risk factors for  coronary artery disease include family history, dyslipidemia, diabetes mellitus, hypertension, obesity, a sedentary lifestyle and post-menopausal.  Hypertension This is a chronic problem. The current episode started more than 1 year ago. Associated symptoms include blurred vision. Pertinent negatives include no chest pain, headaches, palpitations or shortness of breath. Risk factors for coronary artery disease include diabetes mellitus, dyslipidemia, obesity, sedentary lifestyle, post-menopausal state and family history. Hypertensive end-organ damage includes CVA.     Review of Systems  Constitutional: Positive for fatigue. Negative for chills, fever and unexpected weight change.  HENT: Negative for trouble swallowing and voice change.   Eyes: Positive for blurred vision. Negative for visual disturbance.  Respiratory: Negative for cough, shortness of breath and wheezing.   Cardiovascular: Negative for chest pain, palpitations and leg swelling.  Gastrointestinal: Negative for diarrhea, nausea and vomiting.  Endocrine: Positive for polydipsia and polyuria. Negative for cold intolerance, heat intolerance and polyphagia.  Musculoskeletal: Positive for  gait problem. Negative for arthralgias and myalgias.  Skin: Negative for color change, pallor, rash and wound.  Neurological: Negative for seizures and headaches.  Psychiatric/Behavioral: Negative for confusion and suicidal ideas.    Objective:    Vitals with BMI 04/06/2020 04/02/2020 04/02/2020  Height '5\' 5"'  - -  Weight 289 lbs 8 oz - -  BMI 67.89 - -  Systolic 381 017 510  Diastolic 258 79 85  Pulse 60 77 81    BP (!) 158/111   Pulse 60   Ht '5\' 5"'  (1.651 m)   Wt 289 lb 8 oz (131.3 kg)   BMI 48.18 kg/m   Wt Readings from Last 3 Encounters:  04/06/20 289 lb 8 oz (131.3 kg)  04/02/20 (!) 310 lb (140.6 kg)  03/13/20 (!) 325 lb (147.4 kg)     Physical Exam Constitutional:      Appearance: She is well-developed.  HENT:     Head: Normocephalic and atraumatic.  Eyes:     Conjunctiva/sclera: Conjunctivae normal.     Pupils: Pupils are equal, round, and reactive to light.  Neck:     Thyroid: No thyromegaly.     Trachea: No tracheal deviation.  Cardiovascular:     Rate and Rhythm: Normal rate and regular rhythm.     Heart sounds: No murmur heard. No friction rub. No gallop.   Pulmonary:     Effort: Pulmonary effort is normal. No respiratory distress.     Breath sounds: No wheezing or rales.  Abdominal:     General: There is no distension.     Palpations: There is no mass.     Tenderness: There is no abdominal tenderness. There is no guarding or rebound.  Musculoskeletal:        General: Normal range of motion.     Cervical back: Normal range of motion and neck supple.  Skin:    General: Skin is warm and dry.     Coloration: Skin is not pale.     Findings: No erythema or rash.  Neurological:     Mental Status: She is alert and oriented to person, place, and time.     Cranial Nerves: No cranial nerve deficit.     Coordination: Coordination normal.     Deep Tendon Reflexes: Reflexes are normal and symmetric.  Psychiatric:        Thought Content: Thought content  normal.        Judgment: Judgment normal.     CMP ( most recent) CMP     Component Value Date/Time  NA 141 09/19/2009 1111   K 4.0 09/19/2009 1111   CL 104 09/19/2009 1111   CO2 31 09/19/2009 1111   GLUCOSE 109 (H) 09/19/2009 1111   BUN 13 08/05/2019 0000   CREATININE 0.8 08/05/2019 0000   CREATININE 0.86 09/19/2009 1111   CALCIUM 9.4 09/19/2009 1111   PROT 6.9 04/27/2008 1242   ALBUMIN 3.6 04/27/2008 1242   AST 23 04/27/2008 1242   ALT 17 04/27/2008 1242   ALKPHOS 74 04/27/2008 1242   BILITOT 0.7 04/27/2008 1242   GFRNONAA 85 08/05/2019 0000   GFRAA 99 08/05/2019 0000     Diabetic Labs (most recent): Lab Results  Component Value Date   HGBA1C 12.6 (A) 03/13/2020   HGBA1C 10.9 (A) 11/15/2019   HGBA1C 11.0 08/05/2019      Assessment & Plan:   1. Type 2 diabetes mellitus with hyperglycemia, with long-term current use of insulin ([place])  - TARHONDA HOLLENBERG has currently uncontrolled symptomatic type 2 DM since  59 years of age.  She presents with better monitoring attempt, still significantly above target. She reports that she feels bad when her blood glucose readings are below 300 mg per DL. And she actively avoids her blood glucose from dropping to below 200 mg per DL . -No documented or reported hypoglycemia. Review of her glycemic profile shows lowest blood glucose readings of 134.   Recent labs reviewed. - I had a long discussion with her about the progressive nature of diabetes and the pathology behind its complications. -her diabetes is complicated by obesity/sedentary life, current diabetes, CVA, CHF  and she remains at a high risk for more acute and chronic complications which include CAD, CVA, CKD, retinopathy, and neuropathy. These are all discussed in detail with her.  - I have counseled her on diet  and weight management  by adopting a carbohydrate restricted/protein rich diet. Patient is encouraged to switch to  unprocessed or minimally processed      complex starch and increased protein intake (animal or plant source), fruits, and vegetables. -  she is advised to stick to a routine mealtimes to eat 3 meals  a day and avoid unnecessary snacks ( to snack only to correct hypoglycemia).   - she acknowledges that there is a room for improvement in her food and drink choices. - Suggestion is made for her to avoid simple carbohydrates  from her diet including Cakes, Sweet Desserts, Ice Cream, Soda (diet and regular), Sweet Tea, Candies, Chips, Cookies, Store Bought Juices, Alcohol in Excess of  1-2 drinks a day, Artificial Sweeteners,  Coffee Creamer, and "Sugar-free" Products, Lemonade. This will help patient to have more stable blood glucose profile and potentially avoid unintended weight gain.  - she has been  scheduled with Jearld Fenton, RDN, CDE for diabetes education.  - I have approached her with the following individualized plan to manage  her diabetes and patient agrees:   -Given her chronic glycemic burden, she will continue to need intensive treatment with basal/bolus insulin .  -Her cousin Jeneen Rinks is offering to keep helping her with treatment plan.  -Reportedly, she becomes symptomatic when her blood glucose drops below 300, will approach her glycemic control slowly.  -I discussed and lowered her Tyler Aas to 50 units nightly, continue NovoLog 10 units 3 times a day with meals  for pre-meal BG readings of 100-157m/dl, plus patient specific correction dose for unexpected hyperglycemia above 1548mdl, associated with strict monitoring of glucose 4 times a day-before meals and at bedtime. -She  will greatly  benefit from CGM. I discussed and prescribed Dexcom device for her. - she is warned not to take insulin without proper monitoring per orders. - Adjustment parameters are given to her for hypo and hyperglycemia in writing. - she is encouraged to call clinic for blood glucose levels less than 70 or above 300 mg /dl. - she her renal function  within normal range, however did not tolerate Metformin causing diarrhea, nausea/vomiting. This will be discontinued.   - she will be considered for incretin therapy as appropriate next visit.  - Specific targets for  A1c;  LDL, HDL,  and Triglycerides were discussed with the patient.  2) Blood Pressure /Hypertension: Her blood pressure is not controlled to target. she was supposed to be on lisinopril, as it look like patient is taking the medication patient advised to bring all her medications for consideration next visit.     3) Lipids/Hyperlipidemia: No recent lipid panel to review.  She was supposed to be pravastatin 40 mg p.o. nightly.  She is advised to bring in her medication for reconciliation.    4)  Weight/Diet: She weighs 287 pounds, with a BMI of 35.0, clearly complicating her diabetes care.   she is  a candidate for weight loss. I discussed with her the fact that loss of 5 - 10% of her  current body weight will have the most impact on her diabetes management.  Exercise, and detailed carbohydrates information provided  -  detailed on discharge instructions.  5) vitamin D deficiency: She is advised to continue vitamin D2 50,000 units weekly.  6) Chronic Care/Health Maintenance:  -she  Is not on ACEI/ARB and Statin medications and  is encouraged to initiate and continue to follow up with Ophthalmology, Dentist,  Podiatrist at least yearly or according to recommendations, and advised to stay away from smoking. I have recommended yearly flu vaccine and pneumonia vaccine at least every 5 years; moderate intensity exercise for up to 150 minutes weekly; and  sleep for at least 7 hours a day.  POCT ABI Results 04/06/20  Her ABIs normal today April 06, 2020 Right ABI: 1.2      left ABI: 1.25  Right leg systolic / diastolic: 093/818 mmHg Left leg systolic / diastolic: 299/371 mmHg  Arm systolic / diastolic: 696/789 mmHG This study will be repeated in 5 years, or sooner if needed.  -  she is  advised to maintain close follow up with Emelda Fear, DO for primary care needs, as well as her other providers for optimal and coordinated care.   - Time spent on this patient care encounter:  40 min, of which > 50% was spent in  counseling and the rest reviewing her blood glucose logs , discussing her hypoglycemia and hyperglycemia episodes, reviewing her current and  previous labs / studies  ( including abstraction from other facilities) and medications  doses and developing a  long term treatment plan and documenting her care.   Please refer to Patient Instructions for Blood Glucose Monitoring and Insulin/Medications Dosing Guide"  in media tab for additional information. Please  also refer to " Patient Self Inventory" in the Media  tab for reviewed elements of pertinent patient history.  Vinnie Level participated in the discussions, expressed understanding, and voiced agreement with the above plans.  All questions were answered to her satisfaction. she is encouraged to contact clinic should she have any questions or concerns prior to her return visit.    Follow up plan: -  Return in about 9 weeks (around 06/08/2020) for F/U with Pre-visit Labs, Meter, Logs, A1c here.Glade Lloyd, MD [facility] [address] [place], [place] [address] Phone: [phone]  Fax: [phone]    04/06/2020, 12:58 PM  This note was partially dictated with voice recognition software. Similar sounding words can be transcribed inadequately or may not  be corrected upon review.

## 2020-04-06 NOTE — Progress Notes (Signed)
Medical Nutrition Therapy  Follow up for Type 2 DM.  Saw Dr. Fransico Him today. Had a recent fall in bathtub and got a concussion. Changes made since last visit: Her first cousin that cares for her and the adopted children is with her. She is eating meals on time, Eating more salads, baked chicken and fresh fruits and vegetables. Drinking mostly water.  BS are coming down from the 300's to upper 100's. Taking insulin as prescribed. Meds: Metformin 500 mg BID, Tresiba 60 units, Novolog 10 units plus sliding scale Has vision issues due to changes in blood sugars. Walks with a cane.  Saw Dr. Fransico Him today. Reduced Tresiba to 50 units a day and Novolog 10 units starting at 100-150 and using sliding scale. Needs to make sure she doesn't take meal time insulin until she eats her meals. Lost some weight but unsure of accuracy due to manual scales instead of digital.  NUTRITION ASSESSMENT   Anthropometrics  Vitals with BMI 03/13/2020  Height 5\' 5"   Weight 315 lbs  BMI 52.42  Systolic 102  Diastolic 74  Pulse 64    Clinical Medical Hx: HTN, morbid obesity, Hyperlipidemia, CHF Medications: Tresiba 5 0, Novolog 10 units with meals plus sliding scale Metformin 500 mg BID. Labs:  Lab Results  Component Value Date   HGBA1C 12.6 (A) 03/13/2020   CMP Latest Ref Rng & Units 08/05/2019 09/19/2009 04/27/2008  Glucose 70 - 99 mg/dL - 06/25/2008) 94  BUN 4 - 21 13 9 10   Creatinine 0.5 - 1.1 0.8 0.86 0.63  Sodium 135 - 145 mEq/L - 141 137  Potassium 3.5 - 5.1 mEq/L - 4.0 3.8  Chloride 96 - 112 mEq/L - 104 103  CO2 19 - 32 mEq/L - 31 28  Calcium 8.4 - 10.5 mg/dL - 9.4 8.9  Total Protein 6.0 - 8.3 g/dL - - 6.9  Total Bilirubin 0.3 - 1.2 mg/dL - - 0.7  Alkaline Phos 39 - 117 units/L - - 74  AST 0 - 37 units/L - - 23  ALT 0 - 35 units/L - - 17    24 hr recall:   B)2 boiled eggs, 716(R sausage,  yogurt and fruit, L) leftovers-same as dinner, water D)chicken on a bun, asparagus and cauliflower, green  peppers, watermelon, water    NUTRITION DIAGNOSIS  Uncontrolled Diabetes Type 2 and Morbid obesity related to excessive calorie intake  as evidenced by BMI > 40 and A1C 12.6%   NUTRITION INTERVENTION  Nutrition education (E-1) on the following topics:  . Diabetes, meal planning . Sliding scale for Novolog insulin with meals. . Treatment of Hypo/hyperglycemia.   Handouts Provided Include   The Plate Method   Diabetes Instructions.   Learning Style & Readiness for Change Teaching method utilized: Visual & Auditory  Demonstrated degree of understanding via: Teach Back  Barriers to learning/adherence to lifestyle change: morbid obesity  Goals Established by Pt Goals Keep up the great job. Follow My Plate, eat meals on time. Bring blood sugar log and meter to all appointments Drink only water  Use sliding scale as prescribed. Walk in house as much as possible. Do not skip meals. Goal BS  100-150 before meals.   MONITORING & EVALUATION Dietary intake, weekly physical activity, and follow up 1 week.  Next Steps  Patient is to Check blood sugars 4 times a day, use sliding scale and bring back in 2 week to see me and Dr. [place].  Record blood sugars on sheets.[place]

## 2020-04-06 NOTE — Patient Instructions (Signed)
                                     Advice for Weight Management  -For most of us the best way to lose weight is by diet management. Generally speaking, diet management means consuming less calories intentionally which over time brings about progressive weight loss.  This can be achieved more effectively by restricting carbohydrate consumption to the minimum possible.  So, it is critically important to know your numbers: how much calorie you are consuming and how much calorie you need. More importantly, our carbohydrates sources should be unprocessed or minimally processed complex starch food items.   Sometimes, it is important to balance nutrition by increasing protein intake (animal or plant source), fruits, and vegetables.  -Sticking to a routine mealtime to eat 3 meals a day and avoiding unnecessary snacks is shown to have a big role in weight control. Under normal circumstances, the only time we lose real weight is when we are hungry, so allow hunger to take place- hunger means no food between meal times, only water.  It is not advisable to starve.   -It is better to avoid simple carbohydrates including: Cakes, Sweet Desserts, Ice Cream, Soda (diet and regular), Sweet Tea, Candies, Chips, Cookies, Store Bought Juices, Alcohol in Excess of  1-2 drinks a day, Lemonade,  Artificial Sweeteners, Doughnuts, Coffee Creamers, "Sugar-free" Products, etc, etc.  This is not a complete list.....    -Consulting with certified diabetes educators is proven to provide you with the most accurate and current information on diet.  Also, you may be  interested in discussing diet options/exchanges , we can schedule a visit with Katherine Barry, RDN, CDE for individualized nutrition education.  -Exercise: If you are able: 30 -60 minutes a day ,4 days a week, or 150 minutes a week.  The longer the better.  Combine stretch, strength, and aerobic activities.  If you were told in the  past that you have high risk for cardiovascular diseases, you may seek evaluation by your heart doctor prior to initiating moderate to intense exercise programs.                                  Additional Care Considerations for Diabetes   -Diabetes  is a chronic disease.  The most important care consideration is regular follow-up with your diabetes care provider with the goal being avoiding or delaying its complications and to take advantage of advances in medications and technology.    -Type 2 diabetes is known to coexist with other important comorbidities such as high blood pressure and high cholesterol.  It is critical to control not only the diabetes but also the high blood pressure and high cholesterol to minimize and delay the risk of complications including coronary artery disease, stroke, amputations, blindness, etc.    - Studies showed that people with diabetes will benefit from a class of medications known as ACE inhibitors and statins.  Unless there are specific reasons not to be on these medications, the standard of care is to consider getting one from these groups of medications at an optimal doses.  These medications are generally considered safe and proven to help protect the heart and the kidneys.    - People with diabetes are encouraged to initiate and maintain regular follow-up with eye doctors, foot   doctors, dentists , and if necessary heart and kidney doctors.     - It is highly recommended that people with diabetes quit smoking or stay away from smoking, and get yearly  flu vaccine and pneumonia vaccine at least every 5 years.  One other important lifestyle recommendation is to ensure adequate sleep - at least 6-7 hours of uninterrupted sleep at night.  -Exercise: If you are able: 30 -60 minutes a day, 4 days a week, or 150 minutes a week.  The longer the better.  Combine stretch, strength, and aerobic activities.  If you were told in the past that you have high risk for  cardiovascular diseases, you may seek evaluation by your heart doctor prior to initiating moderate to intense exercise programs.          

## 2020-04-06 NOTE — Patient Instructions (Signed)
Goals Established by Pt Goals Keep up the great job. Follow My Plate, eat meals on time. Bring blood sugar log and meter to all appointments Drink only water  Use sliding scale as prescribed. Walk in house as much as possible. Do not skip meals. Goal BS  100-150 before meals.

## 2020-05-23 DEATH — deceased

## 2020-06-08 ENCOUNTER — Ambulatory Visit: Payer: BLUE CROSS/BLUE SHIELD | Admitting: "Endocrinology

## 2022-05-03 IMAGING — CT CT HEAD W/O CM
3 series · 16 of 47 positions shown, 19 images · non-contrast
Comparison: None.

CLINICAL DATA: Fall, head injury, loss of consciousness

EXAM:
CT HEAD WITHOUT CONTRAST
TECHNIQUE: Contiguous axial images were obtained from the base of the skull
through the vertex without intravenous contrast.

[Series 2: head w o · axial · 0.46mm/px · z∈[-82,+43]mm · 10 of 31 slices shown, 13 images]
[im 3/31  brain]
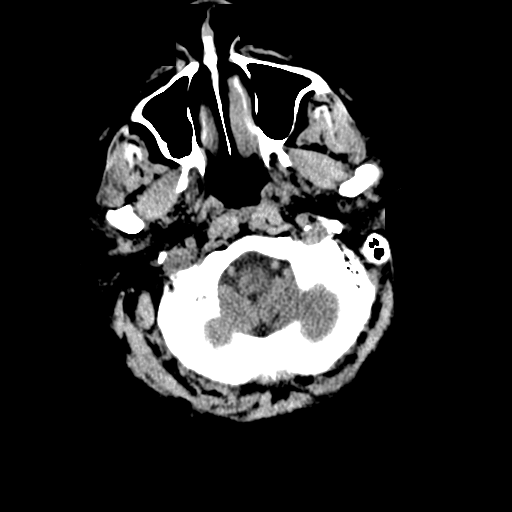
[im 3/31  bone]
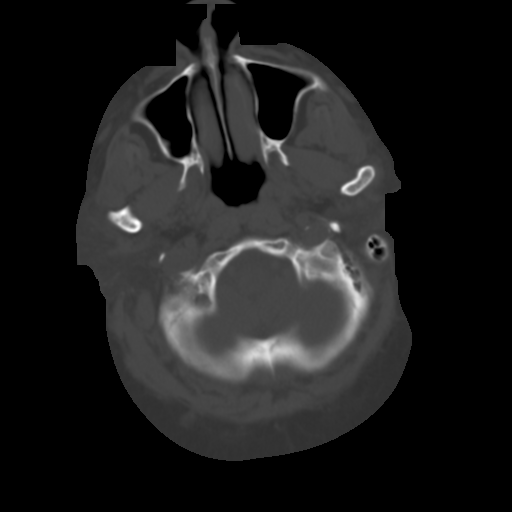
[im 6/31  brain]
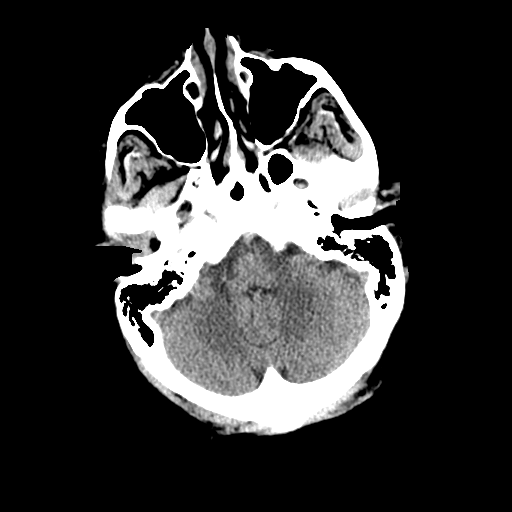
[im 9/31  brain]
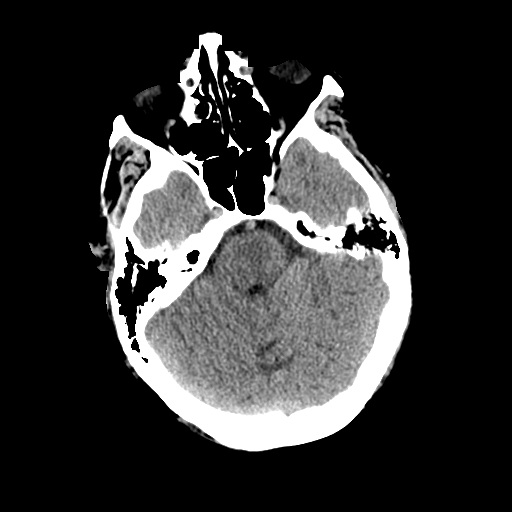
[im 11/31  brain]
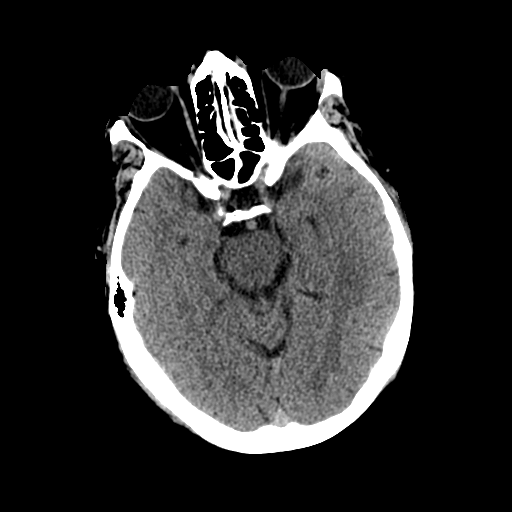
[im 14/31  brain]
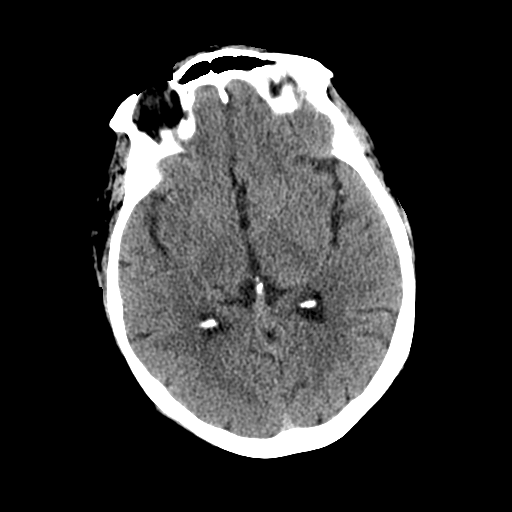
[im 14/31  bone]
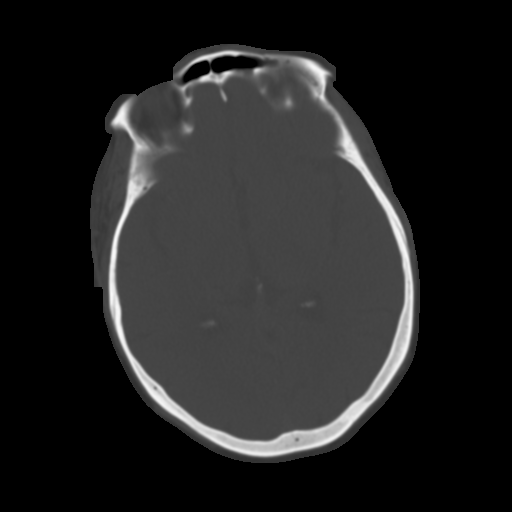
[im 17/31  brain]
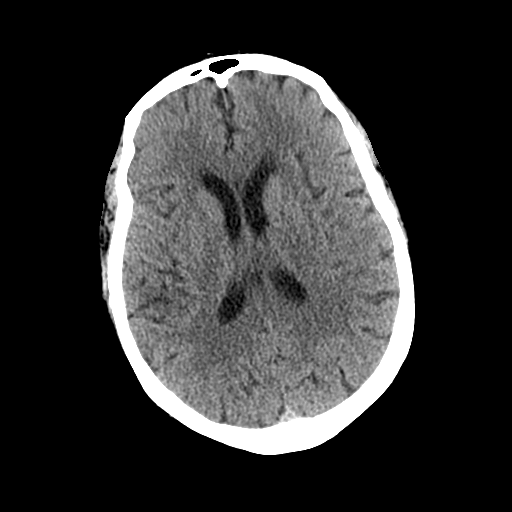
[im 20/31  brain]
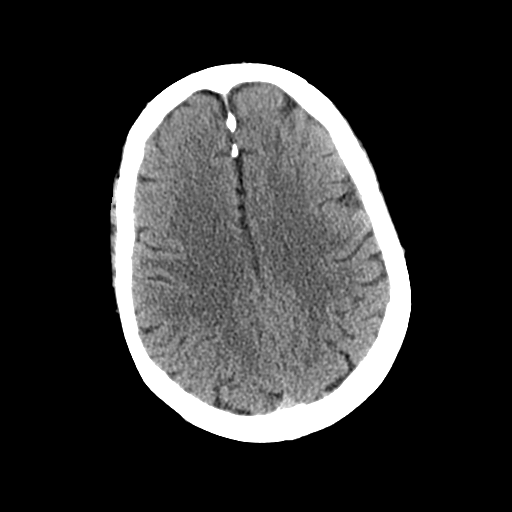
[im 23/31  brain]
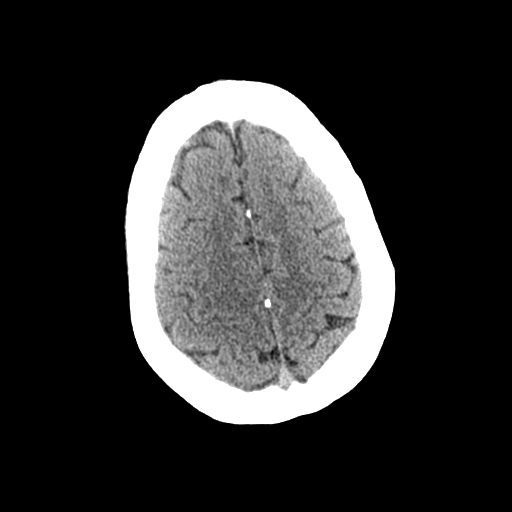
[im 25/31  brain]
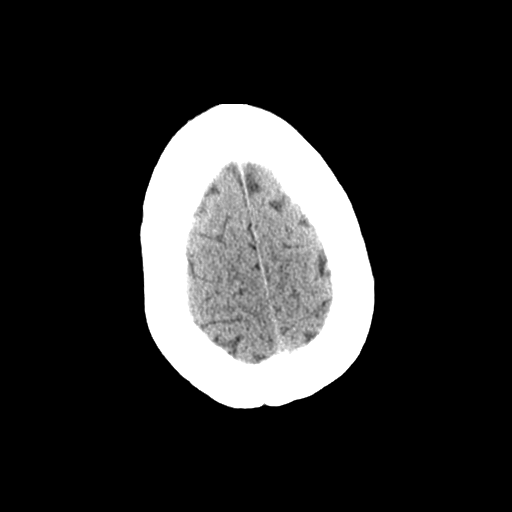
[im 25/31  bone]
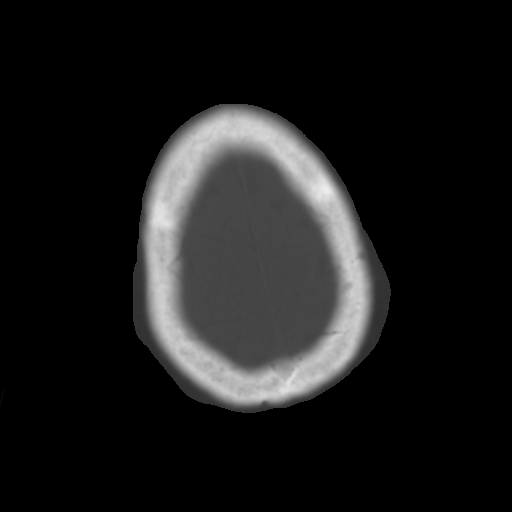
[im 28/31  brain]
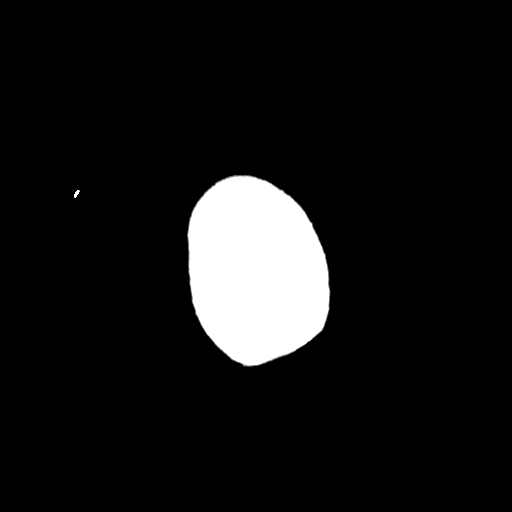

[Series 4: coronal soft · coronal · 0.34mm/px · 3 of 72 slices shown]
[im 24/72  brain]
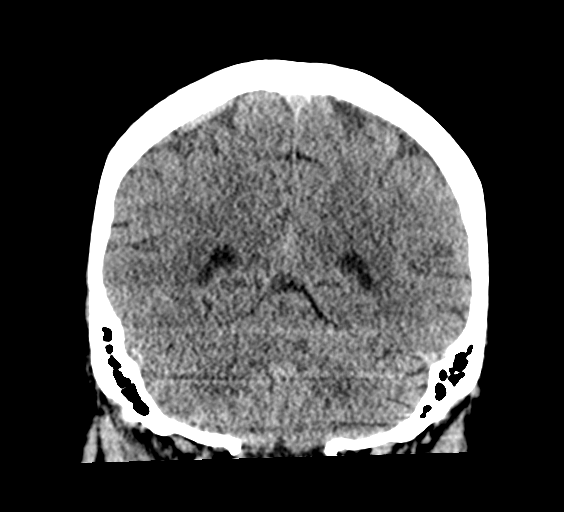
[im 32/72  brain]
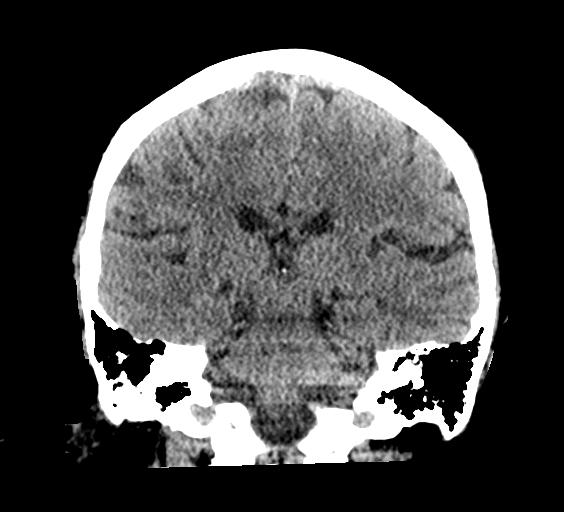
[im 40/72  brain]
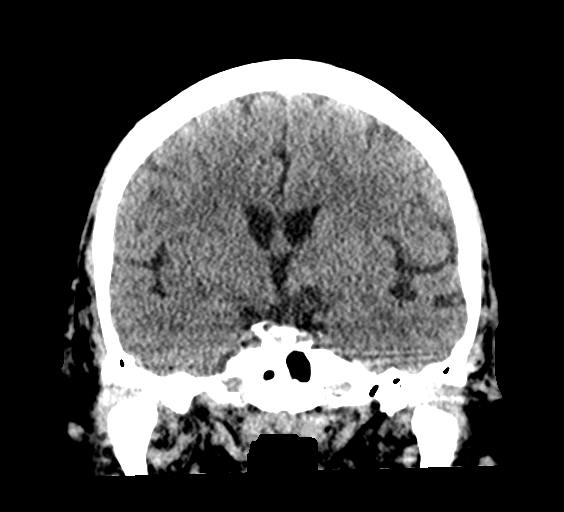

[Series 5: sagittal soft · sagittal · 0.34mm/px · 3 of 63 slices shown]
[im 21/63  brain]
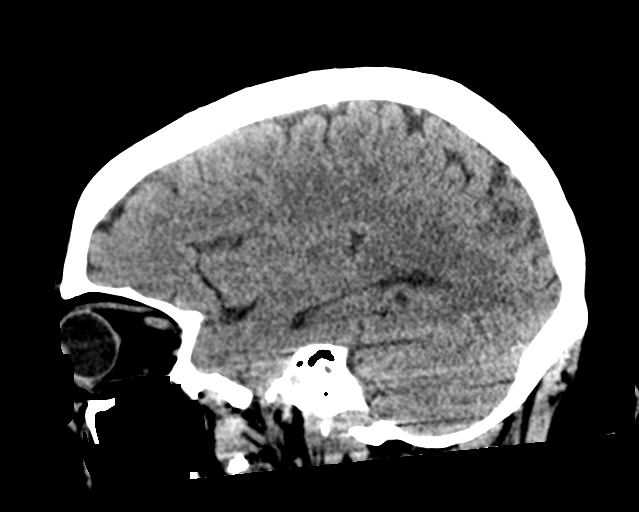
[im 32/63  brain]
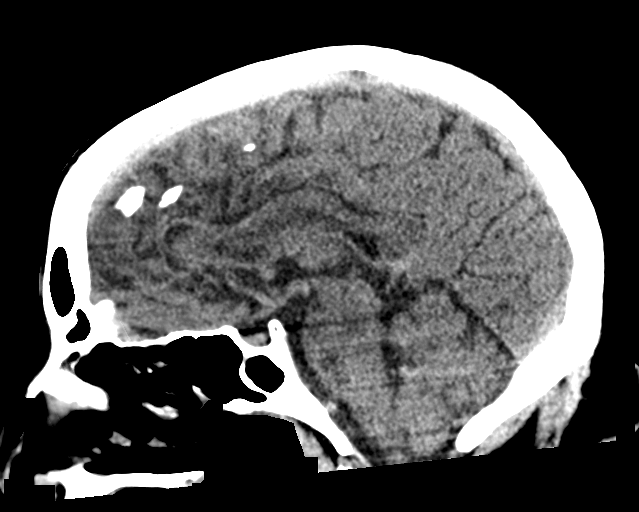
[im 42/63  brain]
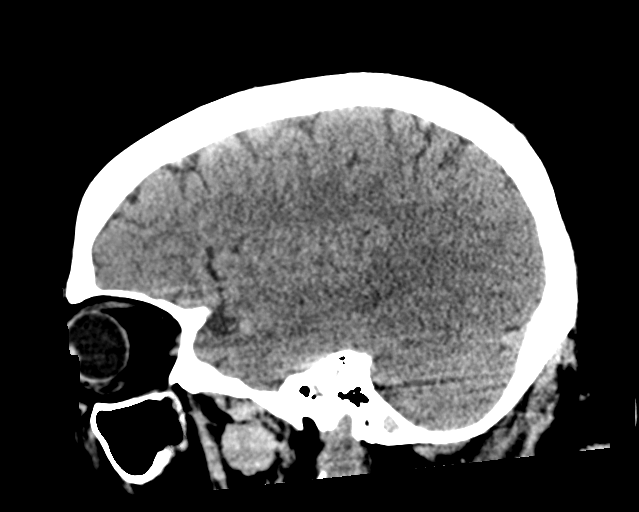

[16 of 47 positions shown; findings below may reference images not displayed]

FINDINGS: Brain: Normal anatomic configuration. No abnormal intra or
extra-axial mass lesion or fluid collection. No abnormal mass effect
or midline shift. No evidence of acute intracranial hemorrhage or
infarct. Ventricular size is normal. Cerebellum unremarkable.

Vascular: Unremarkable

Skull: Intact

Sinuses/Orbits: Paranasal sinuses are clear. Orbits are
unremarkable.

Other: Mastoid air cells and middle ear cavities are clear.
IMPRESSION: No acute intracranial abnormality.  No calvarial fracture.
